# Patient Record
Sex: Male | Born: 1935 | Race: White | Hispanic: No | Smoking: Current every day smoker
Health system: Southern US, Community
[De-identification: ages and names within clinical notes are randomized; demographics above are authoritative.]

## PROBLEM LIST (undated history)

## (undated) DIAGNOSIS — J189 Pneumonia, unspecified organism: Secondary | ICD-10-CM

## (undated) DIAGNOSIS — I10 Essential (primary) hypertension: Secondary | ICD-10-CM

## (undated) DIAGNOSIS — G9340 Encephalopathy, unspecified: Secondary | ICD-10-CM

## (undated) DIAGNOSIS — I6521 Occlusion and stenosis of right carotid artery: Secondary | ICD-10-CM

## (undated) DIAGNOSIS — G309 Alzheimer's disease, unspecified: Secondary | ICD-10-CM

## (undated) DIAGNOSIS — F0280 Dementia in other diseases classified elsewhere without behavioral disturbance: Secondary | ICD-10-CM

---

## 1983-08-22 HISTORY — PX: FOOT FRACTURE SURGERY: SHX645

## 2008-06-11 ENCOUNTER — Emergency Department (HOSPITAL_COMMUNITY): Admission: EM | Admit: 2008-06-11 | Discharge: 2008-06-11 | Payer: Self-pay | Admitting: Emergency Medicine

## 2013-10-19 ENCOUNTER — Emergency Department (HOSPITAL_COMMUNITY): Payer: Medicare Other

## 2013-10-19 ENCOUNTER — Encounter (HOSPITAL_COMMUNITY): Payer: Self-pay | Admitting: Emergency Medicine

## 2013-10-19 ENCOUNTER — Inpatient Hospital Stay (HOSPITAL_COMMUNITY)
Admission: EM | Admit: 2013-10-19 | Discharge: 2013-10-22 | DRG: 056 | Disposition: A | Discharge status: Skilled Nursing Facility | Payer: Medicare Other | Attending: Internal Medicine | Admitting: Internal Medicine

## 2013-10-19 DIAGNOSIS — G309 Alzheimer's disease, unspecified: Principal | ICD-10-CM | POA: Diagnosis present

## 2013-10-19 DIAGNOSIS — R918 Other nonspecific abnormal finding of lung field: Secondary | ICD-10-CM

## 2013-10-19 DIAGNOSIS — G9340 Encephalopathy, unspecified: Secondary | ICD-10-CM | POA: Diagnosis present

## 2013-10-19 DIAGNOSIS — F17200 Nicotine dependence, unspecified, uncomplicated: Secondary | ICD-10-CM | POA: Diagnosis present

## 2013-10-19 DIAGNOSIS — J189 Pneumonia, unspecified organism: Secondary | ICD-10-CM

## 2013-10-19 DIAGNOSIS — IMO0002 Reserved for concepts with insufficient information to code with codable children: Secondary | ICD-10-CM

## 2013-10-19 DIAGNOSIS — F0280 Dementia in other diseases classified elsewhere without behavioral disturbance: Principal | ICD-10-CM | POA: Diagnosis present

## 2013-10-19 DIAGNOSIS — Z720 Tobacco use: Secondary | ICD-10-CM

## 2013-10-19 DIAGNOSIS — R9389 Abnormal findings on diagnostic imaging of other specified body structures: Secondary | ICD-10-CM | POA: Diagnosis present

## 2013-10-19 DIAGNOSIS — I6521 Occlusion and stenosis of right carotid artery: Secondary | ICD-10-CM | POA: Diagnosis present

## 2013-10-19 DIAGNOSIS — R269 Unspecified abnormalities of gait and mobility: Secondary | ICD-10-CM | POA: Diagnosis present

## 2013-10-19 DIAGNOSIS — R32 Unspecified urinary incontinence: Secondary | ICD-10-CM | POA: Diagnosis present

## 2013-10-19 DIAGNOSIS — Z801 Family history of malignant neoplasm of trachea, bronchus and lung: Secondary | ICD-10-CM

## 2013-10-19 DIAGNOSIS — I10 Essential (primary) hypertension: Secondary | ICD-10-CM | POA: Diagnosis present

## 2013-10-19 DIAGNOSIS — R410 Disorientation, unspecified: Secondary | ICD-10-CM

## 2013-10-19 DIAGNOSIS — E440 Moderate protein-calorie malnutrition: Secondary | ICD-10-CM | POA: Diagnosis present

## 2013-10-19 DIAGNOSIS — R531 Weakness: Secondary | ICD-10-CM | POA: Diagnosis present

## 2013-10-19 DIAGNOSIS — I6529 Occlusion and stenosis of unspecified carotid artery: Secondary | ICD-10-CM | POA: Diagnosis present

## 2013-10-19 DIAGNOSIS — Z8673 Personal history of transient ischemic attack (TIA), and cerebral infarction without residual deficits: Secondary | ICD-10-CM

## 2013-10-19 DIAGNOSIS — R29898 Other symptoms and signs involving the musculoskeletal system: Secondary | ICD-10-CM | POA: Diagnosis present

## 2013-10-19 HISTORY — DX: Pneumonia, unspecified organism: J18.9

## 2013-10-19 HISTORY — DX: Occlusion and stenosis of right carotid artery: I65.21

## 2013-10-19 HISTORY — DX: Alzheimer's disease, unspecified: G30.9

## 2013-10-19 HISTORY — DX: Dementia in other diseases classified elsewhere without behavioral disturbance: F02.80

## 2013-10-19 HISTORY — DX: Encephalopathy, unspecified: G93.40

## 2013-10-19 HISTORY — DX: Essential (primary) hypertension: I10

## 2013-10-19 LAB — BASIC METABOLIC PANEL
BUN: 11 mg/dL (ref 6–23)
CO2: 29 meq/L (ref 19–32)
CREATININE: 1.01 mg/dL (ref 0.50–1.35)
Calcium: 8.9 mg/dL (ref 8.4–10.5)
Chloride: 98 mEq/L (ref 96–112)
GFR calc Af Amer: 78 mL/min — ABNORMAL LOW (ref >90)
GFR calc non Af Amer: 68 mL/min — ABNORMAL LOW (ref >90)
Glucose, Bld: 103 mg/dL — ABNORMAL HIGH (ref 70–99)
Potassium: 3.6 mEq/L — ABNORMAL LOW (ref 3.7–5.3)
SODIUM: 137 meq/L (ref 137–147)

## 2013-10-19 LAB — CBC WITH DIFFERENTIAL/PLATELET
BASOS PCT: 0 % (ref 0–1)
Basophils Absolute: 0 10*3/uL (ref 0.0–0.1)
Eosinophils Absolute: 0.1 10*3/uL (ref 0.0–0.7)
Eosinophils Relative: 1 % (ref 0–5)
HCT: 40.7 % (ref 39.0–52.0)
HEMOGLOBIN: 13.7 g/dL (ref 13.0–17.0)
LYMPHS ABS: 0.7 10*3/uL (ref 0.7–4.0)
LYMPHS PCT: 6 % — AB (ref 12–46)
MCH: 29.7 pg (ref 26.0–34.0)
MCHC: 33.7 g/dL (ref 30.0–36.0)
MCV: 88.3 fL (ref 78.0–100.0)
MONO ABS: 0.8 10*3/uL (ref 0.1–1.0)
Monocytes Relative: 7 % (ref 3–12)
NEUTROS ABS: 9.7 10*3/uL — AB (ref 1.7–7.7)
Neutrophils Relative %: 86 % — ABNORMAL HIGH (ref 43–77)
PLATELETS: 188 10*3/uL (ref 150–400)
RBC: 4.61 MIL/uL (ref 4.22–5.81)
RDW: 13.4 % (ref 11.5–15.5)
WBC: 11.2 10*3/uL — ABNORMAL HIGH (ref 4.0–10.5)

## 2013-10-19 LAB — URINALYSIS, ROUTINE W REFLEX MICROSCOPIC
BILIRUBIN URINE: NEGATIVE
GLUCOSE, UA: NEGATIVE mg/dL
Hgb urine dipstick: NEGATIVE
Leukocytes, UA: NEGATIVE
Nitrite: NEGATIVE
PROTEIN: NEGATIVE mg/dL
SPECIFIC GRAVITY, URINE: 1.02 (ref 1.005–1.030)
UROBILINOGEN UA: 1 mg/dL (ref 0.0–1.0)
pH: 6 (ref 5.0–8.0)

## 2013-10-19 LAB — MAGNESIUM: Magnesium: 1.9 mg/dL (ref 1.5–2.5)

## 2013-10-19 MED ORDER — SODIUM CHLORIDE 0.9 % IJ SOLN
3.0000 mL | Freq: Two times a day (BID) | INTRAMUSCULAR | Status: DC
  Administered 2013-10-21: 3 mL via INTRAVENOUS

## 2013-10-19 MED ORDER — DEXTROSE 5 % IV SOLN
500.0000 mg | INTRAVENOUS | Status: DC
  Administered 2013-10-19 – 2013-10-20 (×2): 500 mg via INTRAVENOUS
  Filled 2013-10-19: qty 500

## 2013-10-19 MED ORDER — ALBUTEROL SULFATE (2.5 MG/3ML) 0.083% IN NEBU
2.5000 mg | INHALATION_SOLUTION | Freq: Once | RESPIRATORY_TRACT | Status: AC
  Administered 2013-10-19: 2.5 mg via RESPIRATORY_TRACT
  Filled 2013-10-19: qty 3

## 2013-10-19 MED ORDER — ALBUTEROL SULFATE (2.5 MG/3ML) 0.083% IN NEBU: 5.0000 mg | INHALATION_SOLUTION | Freq: Once | RESPIRATORY_TRACT | Status: DC

## 2013-10-19 MED ORDER — SODIUM CHLORIDE 0.9 % IV SOLN: INTRAVENOUS | Status: DC

## 2013-10-19 MED ORDER — ADULT MULTIVITAMIN W/MINERALS CH
1.0000 | ORAL_TABLET | Freq: Every day | ORAL | Status: DC
  Administered 2013-10-19 – 2013-10-22 (×4): 1 via ORAL
  Filled 2013-10-19 (×4): qty 1

## 2013-10-19 MED ORDER — FOLIC ACID 1 MG PO TABS
1.0000 mg | ORAL_TABLET | Freq: Every day | ORAL | Status: DC
  Administered 2013-10-19 – 2013-10-22 (×4): 1 mg via ORAL
  Filled 2013-10-19 (×4): qty 1

## 2013-10-19 MED ORDER — THIAMINE HCL 100 MG/ML IJ SOLN
100.0000 mg | Freq: Every day | INTRAMUSCULAR | Status: DC
Start: 2013-10-19 — End: 2013-10-20
  Administered 2013-10-19: 100 mg via INTRAVENOUS
  Filled 2013-10-19 (×2): qty 2

## 2013-10-19 MED ORDER — ENOXAPARIN SODIUM 40 MG/0.4ML ~~LOC~~ SOLN
40.0000 mg | SUBCUTANEOUS | Status: DC
  Administered 2013-10-19 – 2013-10-21 (×3): 40 mg via SUBCUTANEOUS
  Filled 2013-10-19 (×3): qty 0.4

## 2013-10-19 MED ORDER — ASPIRIN EC 325 MG PO TBEC
325.0000 mg | DELAYED_RELEASE_TABLET | Freq: Every day | ORAL | Status: DC
  Administered 2013-10-19 – 2013-10-22 (×4): 325 mg via ORAL
  Filled 2013-10-19 (×4): qty 1

## 2013-10-19 MED ORDER — ACETAMINOPHEN 325 MG PO TABS: 650.0000 mg | ORAL_TABLET | Freq: Four times a day (QID) | ORAL | Status: DC | PRN

## 2013-10-19 MED ORDER — CEFTRIAXONE SODIUM 1 G IJ SOLR
1.0000 g | INTRAMUSCULAR | Status: DC
  Administered 2013-10-20: 1 g via INTRAVENOUS
  Filled 2013-10-19 (×2): qty 10

## 2013-10-19 MED ORDER — ONDANSETRON HCL 4 MG PO TABS: 4.0000 mg | ORAL_TABLET | Freq: Four times a day (QID) | ORAL | Status: DC | PRN

## 2013-10-19 MED ORDER — ALBUTEROL SULFATE (2.5 MG/3ML) 0.083% IN NEBU: 2.5000 mg | INHALATION_SOLUTION | RESPIRATORY_TRACT | Status: DC | PRN

## 2013-10-19 MED ORDER — SODIUM CHLORIDE 0.9 % IV SOLN: INTRAVENOUS | Status: AC

## 2013-10-19 MED ORDER — ACETAMINOPHEN 650 MG RE SUPP: 650.0000 mg | Freq: Four times a day (QID) | RECTAL | Status: DC | PRN

## 2013-10-19 MED ORDER — DEXTROSE 5 % IV SOLN
1.0000 g | Freq: Once | INTRAVENOUS | Status: AC
  Administered 2013-10-19: 1 g via INTRAVENOUS
  Filled 2013-10-19: qty 10

## 2013-10-19 MED ORDER — ONDANSETRON HCL 4 MG/2ML IJ SOLN: 4.0000 mg | Freq: Four times a day (QID) | INTRAMUSCULAR | Status: DC | PRN

## 2013-10-19 MED ORDER — POTASSIUM CHLORIDE 10 MEQ/100ML IV SOLN
10.0000 meq | INTRAVENOUS | Status: AC
  Administered 2013-10-19 – 2013-10-20 (×4): 10 meq via INTRAVENOUS
  Filled 2013-10-19 (×2): qty 100

## 2013-10-19 MED ORDER — IPRATROPIUM BROMIDE 0.02 % IN SOLN: 0.5000 mg | Freq: Once | RESPIRATORY_TRACT | Status: DC

## 2013-10-19 MED ORDER — IPRATROPIUM-ALBUTEROL 0.5-2.5 (3) MG/3ML IN SOLN
3.0000 mL | Freq: Once | RESPIRATORY_TRACT | Status: AC
  Administered 2013-10-19: 3 mL via RESPIRATORY_TRACT
  Filled 2013-10-19: qty 3

## 2013-10-19 NOTE — ED Provider Notes (Signed)
CSN: [id number]     Arrival date & time 10/19/13  1438 History  This chart was scribed for Ward Givens, MD by Quintella Reichert, ED scribe.  This patient was seen in room APA18/APA18 and the patient's care was started at 3:10 PM.   Chief Complaint  Patient presents with  . Hypertension   Level 5 Caveat for confusion  The history is provided by the patient, a relative and the EMS personnel. No language interpreter was used.    HPI Comments: Kavonte Bearse is a 80 y.o. male brought in by EMS to the Emergency Department for evaluation of high blood pressure.  Pt's ex-wife called EMS and informed them that pt has been generally unwell recently.  When EMS arrived he had BP of 230/120, irregular HR with pulse of 100, and O2 of 90%.  Family also informed EMS that pt's "right leg went limp."  On arrival he was complaining of non-productive cough and generalized weakness to the triage nurse.  Currently he states he has had some difficulty walking for the past 2 days.  He states he is otherwise feeling fine.  He denies headache, CP, SOB, nausea, vomiting, or diarrhea.  He reports that he lives at home with his ex-wife and son.  A family member states that pt has no PCP and takes no prescription medications regularly.  He reports his family has been trying to get him to go to a doctor but he refuses.   Past Medical History  Diagnosis Date  . Hypertension     History reviewed. No pertinent past surgical history.  No family history on file.   History  Substance Use Topics  . Smoking status: Current Every Day Smoker  . Smokeless tobacco: Not on file  . Alcohol Use: No   lives at home Lives with ex-wife  Review of Systems  Respiratory: Positive for cough. Negative for shortness of breath.   Cardiovascular: Negative for chest pain.  Gastrointestinal: Negative for nausea, vomiting and diarrhea.  Neurological: Positive for weakness (generalized weakness and "right leg went limp"). Negative for  headaches.  All other systems reviewed and are negative.      Allergies  Review of patient's allergies indicates no known allergies.  Home Medications  No current outpatient prescriptions on file.  Pulse 106  Temp(Src) 98 F (36.7 C) (Oral)  Resp 20  Ht 5\' 7"  (1.702 m)  Wt 155 lb (70.308 kg)  BMI 24.27 kg/m2  SpO2 94%  Vital signs normal except tachycardia   Physical Exam  Nursing note and vitals reviewed. Constitutional: He appears well-developed and well-nourished.  Non-toxic appearance. He does not appear ill. No distress.  HENT:  Head: Normocephalic and atraumatic.  Right Ear: External ear normal.  Left Ear: External ear normal.  Nose: Nose normal. No mucosal edema or rhinorrhea.  Mouth/Throat: Oropharynx is clear and moist and mucous membranes are normal. No dental abscesses or uvula swelling.  Eyes: Conjunctivae and EOM are normal. Pupils are equal, round, and reactive to light.  Neck: Normal range of motion and full passive range of motion without pain. Neck supple.  Cardiovascular: Normal rate, regular rhythm and normal heart sounds.  Exam reveals no gallop and no friction rub.   No murmur heard. Pulmonary/Chest: Effort normal. No respiratory distress. He has wheezes. He has no rhonchi. He has no rales. He exhibits no tenderness and no crepitus.  Very faint expiratory wheezes diffusely  Abdominal: Soft. Normal appearance and bowel sounds are normal. He exhibits no  distension. There is no tenderness. There is no rebound and no guarding.  Musculoskeletal: Normal range of motion. He exhibits no edema and no tenderness.  Moves all extremities well.   Neurological: He is alert. He has normal strength. No cranial nerve deficit.  Pt does not know the day, month or year. He does not know where he lives.   Skin: Skin is warm, dry and intact. No rash noted. No erythema. No pallor.  Psychiatric: He has a normal mood and affect. His speech is normal and behavior is normal.  His mood appears not anxious.    ED Course  Procedures (including critical care time)  Medications  azithromycin (ZITHROMAX) 500 mg in dextrose 5 % 250 mL IVPB (0 mg Intravenous Stopped 10/19/13 1824)  cefTRIAXone (ROCEPHIN) 1 g in dextrose 5 % 50 mL IVPB (0 g Intravenous Stopped 10/19/13 1635)  ipratropium-albuterol (DUONEB) 0.5-2.5 (3) MG/3ML nebulizer solution 3 mL (3 mLs Nebulization Given 10/19/13 1622)  albuterol (PROVENTIL) (2.5 MG/3ML) 0.083% nebulizer solution 2.5 mg (2.5 mg Nebulization Given 10/19/13 1622)     DIAGNOSTIC STUDIES: Oxygen Saturation is 94% on room air, adequate by my interpretation.    COORDINATION OF CARE: 3:14 PM-Discussed treatment plan which includes EKG, CXR, and labs with pt at bedside and pt agreed to plan.  BP during my exam was 160/90  Patient was given nebulizer treatment for his apparent dyspnea. After reviewing his x-ray he was started on community-acquired pneumonia antibiotics.  [id number] Ex-wife is here who is his primary caregiver. She states the patient has not seen a physician since 1987. She reports he's been getting confused for the past several months. For the last several weeks he has started having difficulty walking even using his walker. She states when he stands up his legs tremble. She states today when he got up somewhere between one to 2 PM his right foot kept turning in and he was having trouble walking.  CT of head and urinalysis ordered.  16:09 Dr Sunnie Nielsen admit to med-surg, team 2  Labs Review Results for orders placed during the hospital encounter of 10/19/13  BASIC METABOLIC PANEL      Result Value Ref Range   Sodium 137  137 - 147 mEq/L   Potassium 3.6 (*) 3.7 - 5.3 mEq/L   Chloride 98  96 - 112 mEq/L   CO2 29  19 - 32 mEq/L   Glucose, Bld 103 (*) 70 - 99 mg/dL   BUN 11  6 - 23 mg/dL   Creatinine, Ser 1.61  0.50 - 1.35 mg/dL   Calcium 8.9  8.4 - 09.6 mg/dL   GFR calc non Af Amer 68 (*) >90 mL/min   GFR calc Af Amer 78 (*) >90  mL/min  CBC WITH DIFFERENTIAL      Result Value Ref Range   WBC 11.2 (*) 4.0 - 10.5 K/uL   RBC 4.61  4.22 - 5.81 MIL/uL   Hemoglobin 13.7  13.0 - 17.0 g/dL   HCT 04.5  40.9 - 81.1 %   MCV 88.3  78.0 - 100.0 fL   MCH 29.7  26.0 - 34.0 pg   MCHC 33.7  30.0 - 36.0 g/dL   RDW 91.4  78.2 - 95.6 %   Platelets 188  150 - 400 K/uL   Neutrophils Relative % 86 (*) 43 - 77 %   Neutro Abs 9.7 (*) 1.7 - 7.7 K/uL   Lymphocytes Relative 6 (*) 12 - 46 %   Lymphs Abs 0.7  0.7 - 4.0 K/uL   Monocytes Relative 7  3 - 12 %   Monocytes Absolute 0.8  0.1 - 1.0 K/uL   Eosinophils Relative 1  0 - 5 %   Eosinophils Absolute 0.1  0.0 - 0.7 K/uL   Basophils Relative 0  0 - 1 %   Basophils Absolute 0.0  0.0 - 0.1 K/uL  URINALYSIS, ROUTINE W REFLEX MICROSCOPIC      Result Value Ref Range   Color, Urine AMBER (*) YELLOW   APPearance CLEAR  CLEAR   Specific Gravity, Urine 1.020  1.005 - 1.030   pH 6.0  5.0 - 8.0   Glucose, UA NEGATIVE  NEGATIVE mg/dL   Hgb urine dipstick NEGATIVE  NEGATIVE   Bilirubin Urine NEGATIVE  NEGATIVE   Ketones, ur TRACE (*) NEGATIVE mg/dL   Protein, ur NEGATIVE  NEGATIVE mg/dL   Urobilinogen, UA 1.0  0.0 - 1.0 mg/dL   Nitrite NEGATIVE  NEGATIVE   Leukocytes, UA NEGATIVE  NEGATIVE    Laboratory interpretation all normal except for leukocytosis, mild hypokalemia   Imaging Review Dg Chest Portable 1 View  10/19/2013   CLINICAL DATA:  Elevated blood pressure.  EXAM: PORTABLE CHEST - 1 VIEW  COMPARISON:  None.  FINDINGS: Remote right rib trauma. Patient rotated right. Normal heart size and mediastinal contours for age. Right costophrenic angle blunting could relate to minimal fluid or pleural thickening. No pneumothorax. Lower lobe predominant interstitial thickening is nonspecific, especially in this age group. Slightly greater on the left. No lobar consolidation. An irregular opacity projecting over the left upper lung may represent a calcified pleural plaque.  IMPRESSION: Lower lobe  predominant interstitial thickening is nonspecific in this age group. Slightly greater on the left. This could represent the sequelae of chronic bronchitis or smoking. Difficult to exclude early acute infectious process, given absence of prior radiographs. Consider further evaluation with PA and lateral radiograph and consideration of plain film followup.  Small right pleural effusion versus pleural thickening.  Suspicion of calcified pleural plaque within the upper left hemi thorax. Considerations include prior complex left pleural effusion or even asbestos related pleural disease. This could also be further characterized with PA and lateral radiographs.   Electronically Signed   By: Jeronimo GreavesKyle  Talbot M.D.   On: 10/19/2013 15:13     EKG Interpretation   Date/Time:  Sunday October 19 2013 14:52:33 EST Ventricular Rate:  100 PR Interval:  166 QRS Duration: 94 QT Interval:  372 QTC Calculation: 479 R Axis:   23 Text Interpretation:  Normal sinus rhythm Possible Left atrial enlargement  Cannot rule out Anterior infarct , age undetermined No previous ECGs  available Confirmed by Glanda Spanbauer  MD-I, Wylodean Shimmel ([id number]) on 10/19/2013 3:45:59 PM      MDM   elderly patient who smokes and has been having worsening confusion, and weakness. He has presence of old strokes on his CT scan and possibly had another stroke today. Patient has not had any medical care in about 25 years. He is being admitted for further evaluation.    Final diagnoses:  Weakness  CAP (community acquired pneumonia)  Urinary incontinence  Confusion    Plan admission  Devoria AlbeIva Shantina Chronister, MD, FACEP   I personally performed the services described in this documentation, which was scribed in my presence. The recorded information has been reviewed and considered.  Devoria AlbeIva Krystyna Cleckley, MD, Armando GangFACEP    Ward GivensIva L Miyoko Hashimi, MD 10/19/13 2126

## 2013-10-19 NOTE — H&P (Signed)
[facility] History and Physical  Jason Pratt EFE:[id number] DOB: 01/24/1930 DOA: 10/19/2013   PCP: Does not have a PCP Specialists: None  Chief Complaint: Confusion, Right leg weakness, unsteady gait  HPI: Jason Pratt is a 80 y.o. male who does not have any significant past medical history primarily because he hasn't seen a physician in many years. Patient is accompanied by his ex-wife and son. Ex-wife was the main historian. Patient was unable to provide much information. Apparently, for the past 2 weeks at least, ex-wife has noticed that the patient has had worsening gait. He almost fell a few times. But hasn't had a fall. He has also had incontinence of urine at times. He was also noted to have speech impairment. And, at times she's also seen right facial droop. No seizure activity. No fever. No chills. She's also noticed that especially for the last few months his memory has been getting worse. There's been no history of any back pain. Patient denies any back pain at this time. No stool incontinence. Patient's ex-wife denies any choking episode with eating or drinking. And, then today patient was noted have weakness in the right leg. He couldn't bear weight on that leg. She also noticed that he was not able to move his right foot at that time. So EMS was called and the patient was brought into the hospital. Patient has a remote history of alcoholism, but hasn't had anything to drink since the 90s. Continues to smoke cigarettes about half a pack on a daily basis. Has required a walker to ambulate over the past one year, but the ex-wife has seen a decline over this time period as well.  Home Medications: Prior to Admission medications   Not on File    Allergies: No Known Allergies  Past Medical History: Past Medical History  Diagnosis Date  . Hypertension     Past Surgical History  Procedure Laterality Date  . Foot fracture surgery Left 1985    Social History: Patient lives with  his ex-wife and son. Half pack of cigarettes on a daily basis. Previous use of alcohol. No illicit drug use. Uses a walker at home.  Family History:  Family History  Problem Relation Age of Onset  . Lung cancer Sister      Review of Systems - unable to do due to his mental status  Physical Examination  Filed Vitals:   10/19/13 1540 10/19/13 1622 10/19/13 1817 10/19/13 1900  BP: 160/79  114/70 157/78  Pulse:    98  Temp:    98.1 F (36.7 C)  TempSrc:    Oral  Resp: 28  31   Height:    _0  (1.6 m)  Weight:    64.2 kg (141 lb 8.6 oz)  SpO2: 96% 98% 97% 98%    BP 157/78  Pulse 98  Temp(Src) 98.1 F (36.7 C) (Oral)  Resp 31  Ht _1  (1.6 m)  Wt 64.2 kg (141 lb 8.6 oz)  BMI 25.08 kg/m2  SpO2 98%  General appearance: alert, appears stated age, distracted, no distress and uncooperative Head: Normocephalic, without obvious abnormality, atraumatic Eyes: conjunctivae/corneas clear. PERRL, EOM's intact.  Throat: lips, mucosa, and tongue normal; teeth and gums normal Neck: no adenopathy, no carotid bruit, no JVD, supple, symmetrical, trachea midline and thyroid not enlarged, symmetric, no tenderness/mass/nodules Resp: Coarse breath sounds bilaterally, without any definite crackles or wheezing Cardio: regular rate and rhythm, S1, S2 normal, no murmur, click, rub or gallop GI: soft, non-tender;  bowel sounds normal; no masses,  no organomegaly Extremities: extremities normal, atraumatic, no cyanosis or edema. Normal range of motion of knees and hips passively. Straight leg raising test was unremarkable bilaterally. Pulses: 2+ and symmetric Skin: Skin color, texture, turgor normal. No rashes or lesions Lymph nodes: Cervical, supraclavicular, and axillary nodes normal. Neurologic: He is alert. No facial droop is noted. Tongue is midline. No cranial nerve deformity. Patient was not fully able to cooperate with my examination. He could not follow certain commands. He was also noted to  have some difficulty with speech and couldn't express himself properly. Strength was 5 out of 5 in both the upper extremities. Right lower extremity was 4-5 out of 5 bilaterally, but reflexes were normal on the left and subdued in the right. He was able to move his right foot without any difficulty. Gait was not assessed. No clear pronator drift was identified. Cerebellar signs are difficult to elicit as the patient could not follow commands.   Laboratory Data: Results for orders placed during the hospital encounter of 10/19/13 (from the past 48 hour(s))  BASIC METABOLIC PANEL     Status: Abnormal   Collection Time    10/19/13  3:18 PM      Result Value Ref Range   Sodium 137  137 - 147 mEq/L   Potassium 3.6 (*) 3.7 - 5.3 mEq/L   Chloride 98  96 - 112 mEq/L   CO2 29  19 - 32 mEq/L   Glucose, Bld 103 (*) 70 - 99 mg/dL   BUN 11  6 - 23 mg/dL   Creatinine, Ser 1.01  0.50 - 1.35 mg/dL   Calcium 8.9  8.4 - 10.5 mg/dL   GFR calc non Af Amer 68 (*) >90 mL/min   GFR calc Af Amer 78 (*) >90 mL/min   Comment: (NOTE)     The eGFR has been calculated using the CKD EPI equation.     This calculation has not been validated in all clinical situations.     eGFR's persistently <90 mL/min signify possible Chronic Kidney     Disease.  CBC WITH DIFFERENTIAL     Status: Abnormal   Collection Time    10/19/13  3:18 PM      Result Value Ref Range   WBC 11.2 (*) 4.0 - 10.5 K/uL   RBC 4.61  4.22 - 5.81 MIL/uL   Hemoglobin 13.7  13.0 - 17.0 g/dL   HCT 40.7  39.0 - 52.0 %   MCV 88.3  78.0 - 100.0 fL   MCH 29.7  26.0 - 34.0 pg   MCHC 33.7  30.0 - 36.0 g/dL   RDW 13.4  11.5 - 15.5 %   Platelets 188  150 - 400 K/uL   Neutrophils Relative % 86 (*) 43 - 77 %   Neutro Abs 9.7 (*) 1.7 - 7.7 K/uL   Lymphocytes Relative 6 (*) 12 - 46 %   Lymphs Abs 0.7  0.7 - 4.0 K/uL   Monocytes Relative 7  3 - 12 %   Monocytes Absolute 0.8  0.1 - 1.0 K/uL   Eosinophils Relative 1  0 - 5 %   Eosinophils Absolute 0.1  0.0 -  0.7 K/uL   Basophils Relative 0  0 - 1 %   Basophils Absolute 0.0  0.0 - 0.1 K/uL  URINALYSIS, ROUTINE W REFLEX MICROSCOPIC     Status: Abnormal   Collection Time    10/19/13  4:50 PM  Result Value Ref Range   Color, Urine AMBER (*) YELLOW   Comment: BIOCHEMICALS MAY BE AFFECTED BY COLOR   APPearance CLEAR  CLEAR   Specific Gravity, Urine 1.020  1.005 - 1.030   pH 6.0  5.0 - 8.0   Glucose, UA NEGATIVE  NEGATIVE mg/dL   Hgb urine dipstick NEGATIVE  NEGATIVE   Bilirubin Urine NEGATIVE  NEGATIVE   Ketones, ur TRACE (*) NEGATIVE mg/dL   Protein, ur NEGATIVE  NEGATIVE mg/dL   Urobilinogen, UA 1.0  0.0 - 1.0 mg/dL   Nitrite NEGATIVE  NEGATIVE   Leukocytes, UA NEGATIVE  NEGATIVE   Comment: MICROSCOPIC NOT DONE ON URINES WITH NEGATIVE PROTEIN, BLOOD, LEUKOCYTES, NITRITE, OR GLUCOSE <1000 mg/dL.    Radiology Reports: Ct Head Wo Contrast  10/19/2013   CLINICAL DATA:  Altered level of consciousness.  Hypertension.  EXAM: CT HEAD WITHOUT CONTRAST  TECHNIQUE: Contiguous axial images were obtained from the base of the skull through the vertex without intravenous contrast.  COMPARISON:  None.  FINDINGS: No intracranial hemorrhage.  Moderate small vessel disease type changes.  Remote small frontal lobe infarcts and right thalamic infarct. No CT evidence of large acute infarct. If small/medium size infarct is of high clinical concern, MR can be obtained for further delineation.  Atrophy. Ventricular prominence may be related to atrophy. Difficult to exclude a very mild component of hydrocephalus.  No intracranial mass lesion noted on this unenhanced exam.  Vascular calcifications.  IMPRESSION: No intracranial hemorrhage.  Moderate small vessel disease type changes.  Remote small frontal lobe infarcts and right thalamic infarct. No CT evidence of large acute infarct.  Atrophy. Ventricular prominence may be related to atrophy. Difficult to exclude a very mild component of hydrocephalus.  Please see above.    Electronically Signed   By: Chauncey Cruel M.D.   On: 10/19/2013 17:43   Dg Chest Portable 1 View  10/19/2013   CLINICAL DATA:  Elevated blood pressure.  EXAM: PORTABLE CHEST - 1 VIEW  COMPARISON:  None.  FINDINGS: Remote right rib trauma. Patient rotated right. Normal heart size and mediastinal contours for age. Right costophrenic angle blunting could relate to minimal fluid or pleural thickening. No pneumothorax. Lower lobe predominant interstitial thickening is nonspecific, especially in this age group. Slightly greater on the left. No lobar consolidation. An irregular opacity projecting over the left upper lung may represent a calcified pleural plaque.  IMPRESSION: Lower lobe predominant interstitial thickening is nonspecific in this age group. Slightly greater on the left. This could represent the sequelae of chronic bronchitis or smoking. Difficult to exclude early acute infectious process, given absence of prior radiographs. Consider further evaluation with PA and lateral radiograph and consideration of plain film followup.  Small right pleural effusion versus pleural thickening.  Suspicion of calcified pleural plaque within the upper left hemi thorax. Considerations include prior complex left pleural effusion or even asbestos related pleural disease. This could also be further characterized with PA and lateral radiographs.   Electronically Signed   By: Abigail Miyamoto M.D.   On: 10/19/2013 15:13    Electrocardiogram: Sinus rhythm. 100 beats per minute. Normal axis. Intervals are normal. No Q waves. No concerning ST or T-wave changes are noted.  Problem List  Principal Problem:   Acute encephalopathy Active Problems:   Weakness   Right leg weakness   CAP (community acquired pneumonia)   Urinary incontinence   Abnormal CXR   Assessment: This is 80 year old, Caucasian male, who doesn't normally see a  physician, who presents with worsening confusion, unsteady gait and possible right lower extremity  weakness earlier today. CT of the head shows old strokes. It's likely patient may have had an ischemic event recently. It is also likely that he has significant cognitive impairment. He could have nutritional deficiencies. Unlikely to be any lumbar spine pathology based on examination, and history.  Plan: #1 right lower extremity weakness, and unsteady gait: He will require further neurological workup in the form of MRI brain. Will order  carotid Dopplers. [facility] consult neurology. He'll be given aspirin. Check B12 levels. PT and OT will be consulted. CT scan of the head raised the possibility of hydrocephalus, but was thought to be mild and possibly related to atrophy. We'll wait for neurological opinion.  #2 acute encephalopathy: I think his cognitive impairment has been going on for a long period of time. He may have had an acute worsening in the last couple of weeks to months. This could be vascular dementia. He could also have Alzheimers dementia. We'll wait for MRI. Dementia workup, will be initiated with TSH, RPR, B12. EEG will be ordered as well. Neurology input will be obtained. Urine drug screen will be checked. Swallow evaluation will be obtained  #3 questionable community acquired pneumonia: Continue with ceftriaxone and azithromycin. Check for strep and Legionella antigens in the urine.  #4 Abnormal chest x-ray: The possibility was raised about a pleural plaque in the left upper lobe. We'll get a 2 view chest x-ray in the morning. Consideration can be given to other imaging study such as a CT scan depending on findings on the 2 view x-ray. He is a smoker and so is at risk for malignancy.  DVT Prophylaxis: Lovenox Code Status: Full code Family Communication: Discussed with the patient's ex-wife and son  Disposition Plan: Admitted to telemetry   Further management decisions will depend on results of further testing and patient's response to treatment.  [facility]  [facility] Pager [phone]  If 7PM-7AM, please contact night-coverage [email or web address] Password [email or web address]  10/19/2013, 8:05 PM

## 2013-10-19 NOTE — ED Notes (Signed)
Pt here for evaluation of high blood pressure

## 2013-10-19 NOTE — ED Notes (Signed)
Care giver here now. States pt was  Having trouble with his right leg. States he was trying to ambulate to the bathroom on his walker and he almost fell.

## 2013-10-20 ENCOUNTER — Inpatient Hospital Stay (HOSPITAL_COMMUNITY): Payer: Medicare Other

## 2013-10-20 ENCOUNTER — Inpatient Hospital Stay (HOSPITAL_COMMUNITY)
Admit: 2013-10-20 | Discharge: 2013-10-20 | Disposition: A | Discharge status: Home / Self Care | Payer: Medicare Other | Attending: Internal Medicine | Admitting: Internal Medicine

## 2013-10-20 DIAGNOSIS — I517 Cardiomegaly: Secondary | ICD-10-CM

## 2013-10-20 LAB — CBC
HCT: 37 % — ABNORMAL LOW (ref 39.0–52.0)
Hemoglobin: 12.4 g/dL — ABNORMAL LOW (ref 13.0–17.0)
MCH: 29.5 pg (ref 26.0–34.0)
MCHC: 33.5 g/dL (ref 30.0–36.0)
MCV: 87.9 fL (ref 78.0–100.0)
PLATELETS: 162 10*3/uL (ref 150–400)
RBC: 4.21 MIL/uL — ABNORMAL LOW (ref 4.22–5.81)
RDW: 13.3 % (ref 11.5–15.5)
WBC: 8.2 10*3/uL (ref 4.0–10.5)

## 2013-10-20 LAB — COMPREHENSIVE METABOLIC PANEL
ALBUMIN: 3.1 g/dL — AB (ref 3.5–5.2)
ALK PHOS: 105 U/L (ref 39–117)
ALT: 10 U/L (ref 0–53)
AST: 15 U/L (ref 0–37)
BUN: 8 mg/dL (ref 6–23)
CO2: 28 mEq/L (ref 19–32)
Calcium: 8.8 mg/dL (ref 8.4–10.5)
Chloride: 104 mEq/L (ref 96–112)
Creatinine, Ser: 0.85 mg/dL (ref 0.50–1.35)
GFR calc non Af Amer: 80 mL/min — ABNORMAL LOW (ref >90)
Glucose, Bld: 92 mg/dL (ref 70–99)
POTASSIUM: 3.9 meq/L (ref 3.7–5.3)
Sodium: 141 mEq/L (ref 137–147)
Total Bilirubin: 0.8 mg/dL (ref 0.3–1.2)
Total Protein: 6.9 g/dL (ref 6.0–8.3)

## 2013-10-20 LAB — RAPID URINE DRUG SCREEN, HOSP PERFORMED
AMPHETAMINES: NOT DETECTED
Barbiturates: NOT DETECTED
Benzodiazepines: POSITIVE — AB
Cocaine: NOT DETECTED
OPIATES: NOT DETECTED
Tetrahydrocannabinol: NOT DETECTED

## 2013-10-20 LAB — PROTIME-INR
INR: 1.11 (ref 0.00–1.49)
Prothrombin Time: 14.1 seconds (ref 11.6–15.2)

## 2013-10-20 LAB — HOMOCYSTEINE: HOMOCYSTEINE-NORM: 11.5 umol/L (ref 4.0–15.4)

## 2013-10-20 LAB — VITAMIN B12
VITAMIN B 12: 398 pg/mL (ref 211–911)
Vitamin B-12: 446 pg/mL (ref 211–911)

## 2013-10-20 LAB — RPR
RPR Ser Ql: NONREACTIVE
RPR: NONREACTIVE

## 2013-10-20 LAB — TSH
TSH: 2.005 u[IU]/mL (ref 0.350–4.500)
TSH: 1.668 u[IU]/mL (ref 0.350–4.500)

## 2013-10-20 LAB — HEMOGLOBIN A1C
Hgb A1c MFr Bld: 5.4 % (ref <5.7)
Mean Plasma Glucose: 108 mg/dL (ref <117)

## 2013-10-20 LAB — STREP PNEUMONIAE URINARY ANTIGEN: STREP PNEUMO URINARY ANTIGEN: NEGATIVE

## 2013-10-20 LAB — HIV ANTIBODY (ROUTINE TESTING W REFLEX): HIV: NONREACTIVE

## 2013-10-20 MED ORDER — ENSURE COMPLETE PO LIQD
237.0000 mL | Freq: Two times a day (BID) | ORAL | Status: DC
  Administered 2013-10-20 – 2013-10-22 (×4): 237 mL via ORAL

## 2013-10-20 MED ORDER — VITAMIN B-1 100 MG PO TABS
100.0000 mg | ORAL_TABLET | Freq: Every day | ORAL | Status: DC
Start: 2013-10-20 — End: 2013-10-22
  Administered 2013-10-20 – 2013-10-22 (×3): 100 mg via ORAL
  Filled 2013-10-20 (×3): qty 1

## 2013-10-20 MED ORDER — DONEPEZIL HCL 5 MG PO TABS
5.0000 mg | ORAL_TABLET | Freq: Every day | ORAL | Status: DC
  Administered 2013-10-20 – 2013-10-21 (×2): 5 mg via ORAL
  Filled 2013-10-20 (×2): qty 1

## 2013-10-20 NOTE — Progress Notes (Signed)
INITIAL NUTRITION ASSESSMENT  DOCUMENTATION CODES Per approved criteria  -Non-severe (moderate) malnutrition in the context of chronic illness   INTERVENTION: Ensure Complete po BID, each supplement provides 350 kcal and 13 grams of protein  NUTRITION DIAGNOSIS: Inadequate oral intake related to decreased appetite as evidenced by hx wt loss, poor appetite PTA.   Goal: Pt will meet >90% of estimated nutritional needs  Monitor:  PO intake, skin assessments, weight changes, labs, I/O's  Reason for Assessment: MST=3  80 y.o. male  Admitting Dx: Acute encephalopathy  ASSESSMENT: Pt admitted for CAP. He complains of being hungry, however, PTA admits to poor appetite for at least 1 month. Ex-wife at bedside reports that intake has been minimal and pt has only been eating sweets, coffee, and Pepsi. She reports that she has been serving him chopped meats, but he will usually eat only 2-3 bites of what is served at meals.  She reports frustration, stating she has been preparing several meals a days in attempt to get him to eat. She has recently been giving him one Ensure daily as well. No current PO data available at this time, but ex-wife confirms intake has been poor during admission as well.  Appreciative of dysphagia 1 diet order, due to masticatory difficulty, as pt has no teeth.  Pt and ex-wife report UBW of 165#. They confirm weight loss, but unable to quantify time frame. Pt estimates he has lost approximately 10# in the last 3 months. This would be a 6.6% wt loss x 3 months, which is not clinically significant. Unfortunately, there is no further weight hx available at this time, as pt has not seen a physician in many years, despite encouragement from family members.   Nutrition Focused Physical Exam:  Subcutaneous Fat:  Orbital Region: WDL Upper Arm Region: WDL Thoracic and Lumbar Region: WDL  Muscle:  Temple Region: moderate depletion Clavicle Bone Region: WDL Clavicle and  Acromion Bone Region: WDL Scapular Bone Region: moderate depletion Dorsal Hand: WDL Patellar Region: moderate depletion Anterior Thigh Region: moderate depletion Posterior Calf Region: WDL  Edema: none present  Pt meets criteria for moderate MALNUTRITION in the context of chronic illness as evidenced by <75% of estimated energy intake x 1 month, moderate muscle depletion.  Height: Ht Readings from Last 1 Encounters:  10/19/13 5\' 3"  (1.6 m)    Weight: Wt Readings from Last 1 Encounters:  10/19/13 141 lb 8.6 oz (64.2 kg)    Ideal Body Weight: 124#  % Ideal Body Weight: 114%  Wt Readings from Last 10 Encounters:  10/19/13 141 lb 8.6 oz (64.2 kg)    Usual Body Weight: 165#  % Usual Body Weight: 85%  BMI:  Body mass index is 25.08 kg/(m^2). Meets criteria for overweight.  Estimated Nutritional Needs: Kcal: 1200-1300 daily Protein: 51-64 grams daily Fluid: 1.2-1.3 L daily  Skin: WDL  Diet Order: Dysphagia  EDUCATION NEEDS: -Education needs addressed   Intake/Output Summary (Last 24 hours) at 10/20/13 1150 Last data filed at 10/20/13 1000  Gross per 24 hour  Intake      0 ml  Output   1050 ml  Net  -1050 ml    Last BM: 10/19/13   Labs:   Recent Labs Lab 10/19/13 1518 10/20/13 0648  NA 137 141  K 3.6* 3.9  CL 98 104  CO2 29 28  BUN 11 8  CREATININE 1.01 0.85  CALCIUM 8.9 8.8  MG 1.9  --   GLUCOSE 103* 92    CBG (  last 3)  No results found for this basename: GLUCAP,  in the last 72 hours  Scheduled Meds: . aspirin EC  325 mg Oral Daily  . azithromycin  500 mg Intravenous Q24H  . cefTRIAXone (ROCEPHIN)  IV  1 g Intravenous Q24H  . donepezil  5 mg Oral QHS  . enoxaparin (LOVENOX) injection  40 mg Subcutaneous Q24H  . folic acid  1 mg Oral Daily  . multivitamin with minerals  1 tablet Oral Daily  . sodium chloride  3 mL Intravenous Q12H  . thiamine  100 mg Oral Daily    Continuous Infusions:   Past Medical History  Diagnosis Date  .  Hypertension     Past Surgical History  Procedure Laterality Date  . Foot fracture surgery Left 1985    [facility] Odoherty A. Mayford Knife, RD, LDN Pager: [phone]

## 2013-10-20 NOTE — Progress Notes (Signed)
[facility] PROGRESS NOTE  Jason Pratt [facility]:[id number] DOB: 12-Dec-1929 DOA: 10/19/2013 PCP: No PCP Per Patient   Assessment: This is 80 year old, Caucasian male, who doesn't normally see a physician, who presented with worsening confusion, unsteady gait and possible right lower extremity weakness. CT of the head showed old strokes. Awaiting neurology consult as well as MRI, echo.     Assessment/Plan: #1 right lower extremity weakness, and unsteady gait: concern for stroke, worsening dementia and/or nutritional deficits. Likely related to worsening dementia per clinical history.  MRI brain without acute abnormality. Carotid Dopplers with moderate atherosclerotic plaque at both carotid bifurcations. Estimated right ICA stenosis is 50- 69% and left ICA stenosis is less than 50. Await neurology recommendations. Continue aspirin. B12, RPR, TSH, Homocysteine levels pending. PT recommending snf. CT scan of the head raised the possibility of hydrocephalus, but was thought to be mild and possibly related to atrophy. We'll wait for neurological opinion. Will request social work consult for placement.   #2 acute encephalopathy: Clinical hx indicates going on for a long period of time. Neuro opines likely related to dementia of Alzheimer's type. TSH, RPR, B12 pending. EEG pending. Appreciate Neurology input. Urine drug screen +benzo.   #3 questionable community acquired pneumonia: Continue with ceftriaxone and azithromycin day #2. await strep and Legionella antigens in the urine. He is afebrile and non-toxic appearing. Oxygen saturation level >94% on room air.    #4 Abnormal chest x-ray: The possibility was raised about a pleural plaque in the left upper lobe. 2 view chest x-ray yields linear opacity in the periphery of the low upper left hemithorax may reflect scarring, atelectasis or pleural plaque. He is a smoker and so is at risk for malignancy.     Code Status: full Family Communication: ex-wife  at bedside Disposition Plan: snf hopefully tomorrow   Consultants:  Dr. Gerilyn Pilgrim neurology  Procedures: Carotid duplex with Moderate atherosclerotic plaque at both carotid bifurcations. Estimated right ICA stenosis is 50- 69% and left ICA stenosis is  less than 50%.  Antibiotics:  Azithromycin 10/19/13>>  Rocephin 10/19/13>>>  HPI/Subjective: Sitting in chair. Denies pain/discomfort.   Objective: Filed Vitals:   10/20/13 0404  BP: 141/77  Pulse: 92  Temp: 98.1 F (36.7 C)  Resp: 20    Intake/Output Summary (Last 24 hours) at 10/20/13 1139 Last data filed at 10/20/13 1000  Gross per 24 hour  Intake      0 ml  Output   1050 ml  Net  -1050 ml   Filed Weights   10/19/13 1440 10/19/13 1900  Weight: 70.308 kg (155 lb) 64.2 kg (141 lb 8.6 oz)    Exam:   General:  Well nourished somewhat unkept appearing NAD  Cardiovascular: RRR No MGR no LE edema  Respiratory: normal effort BS with diffuse coarseness. No wheeze no crackles  Abdomen: soft +BS non-tender to palpation  Musculoskeletal: bilateral UE strength 5/5.  RLE strength 4/5 LLE strength 5/5. No clubbing or cyanosis   Neuro: speech slow but clear. Facial symmetry. Able to follow simple commands.   Data Reviewed: Basic Metabolic Panel:  Recent Labs Lab 10/19/13 1518 10/20/13 0648  NA 137 141  K 3.6* 3.9  CL 98 104  CO2 29 28  GLUCOSE 103* 92  BUN 11 8  CREATININE 1.01 0.85  CALCIUM 8.9 8.8  MG 1.9  --    Liver Function Tests:  Recent Labs Lab 10/20/13 0648  AST 15  ALT 10  ALKPHOS 105  BILITOT 0.8  PROT 6.9  ALBUMIN 3.1*   No results found for this basename: LIPASE, AMYLASE,  in the last 168 hours No results found for this basename: AMMONIA,  in the last 168 hours CBC:  Recent Labs Lab 10/19/13 1518 10/20/13 0648  WBC 11.2* 8.2  NEUTROABS 9.7*  --   HGB 13.7 12.4*  HCT 40.7 37.0*  MCV 88.3 87.9  PLT 188 162   Cardiac Enzymes: No results found for this basename: CKTOTAL,  CKMB, CKMBINDEX, TROPONINI,  in the last 168 hours BNP (last 3 results) No results found for this basename: PROBNP,  in the last 8760 hours CBG: No results found for this basename: GLUCAP,  in the last 168 hours  No results found for this or any previous visit (from the past 240 hour(s)).   Studies: Dg Chest 2 View  10/20/2013   CLINICAL DATA:  Pneumonia.  EXAM: CHEST  2 VIEW  COMPARISON:  Chest x-ray 10/19/2013.  FINDINGS: Linear opacities in the periphery of the upper left hemithorax are again noted, favored to represent areas of subsegmental atelectasis, scarring or potential pleural plaques. Patchy areas of interstitial prominence are noted throughout the mid to lower lungs bilaterally. No frank consolidative airspace disease. No definite pleural effusions. No evidence of pulmonary edema. Heart size is normal. The patient is rotated to the upright on today's exam, resulting in distortion of the mediastinal contours and reduced diagnostic sensitivity and specificity for mediastinal pathology. Atherosclerosis in the thoracic aorta.  IMPRESSION: 1. Patchy areas of interstitial prominence throughout the mid to lower lungs bilaterally. Whether or not this is chronic or indicative of an acute process such is infection or sequela of mild aspiration is uncertain. 2. Linear opacity in the periphery of the low upper left hemithorax may reflect scarring, atelectasis or pleural plaque. 3. Atherosclerosis.   Electronically Signed   By: Trudie Reedaniel  Entrikin M.D.   On: 10/20/2013 08:21   Ct Head Wo Contrast  10/19/2013   CLINICAL DATA:  Altered level of consciousness.  Hypertension.  EXAM: CT HEAD WITHOUT CONTRAST  TECHNIQUE: Contiguous axial images were obtained from the base of the skull through the vertex without intravenous contrast.  COMPARISON:  None.  FINDINGS: No intracranial hemorrhage.  Moderate small vessel disease type changes.  Remote small frontal lobe infarcts and right thalamic infarct. No CT evidence of  large acute infarct. If small/medium size infarct is of high clinical concern, MR can be obtained for further delineation.  Atrophy. Ventricular prominence may be related to atrophy. Difficult to exclude a very mild component of hydrocephalus.  No intracranial mass lesion noted on this unenhanced exam.  Vascular calcifications.  IMPRESSION: No intracranial hemorrhage.  Moderate small vessel disease type changes.  Remote small frontal lobe infarcts and right thalamic infarct. No CT evidence of large acute infarct.  Atrophy. Ventricular prominence may be related to atrophy. Difficult to exclude a very mild component of hydrocephalus.  Please see above.   Electronically Signed   By: Bridgett LarssonSteve  Olson M.D.   On: 10/19/2013 17:43   Mr Brain Wo Contrast  10/20/2013   CLINICAL DATA:  80 year old male with right lower extremity weakness and confusion. Hypertension and prior stroke. Initial encounter.  EXAM: MRI HEAD WITHOUT CONTRAST  TECHNIQUE: Multiplanar, multiecho pulse sequences of the brain and surrounding structures were obtained without intravenous contrast.  COMPARISON:  Head CT without contrast 10/19/2013.  FINDINGS: Study is intermittently degraded by motion artifact despite repeated imaging attempts.  Cerebral volume loss, appears to disproportionally affect the parietal lobes.  No restricted diffusion or evidence of acute infarction. Intracranial artery dolichoectasia. Dominant, dolichoectatic distal left vertebral artery. Major intracranial vascular flow voids are preserved.  Patchy and confluent bilateral cerebral white matter T2 and FLAIR hyperintensity. Prominent ventricles felt to be ex vacuo in nature. No midline shift, mass effect, or evidence of intracranial mass lesion. Chronic hemorrhage near the right motor strip (series 8, image 19) with otherwise subtle encephalomalacia. Chronic deep gray matter lacunar infarcts, most notably involving the right thalamus.  Visualized orbit soft tissues are within  normal limits. Minor paranasal sinus mucosal thickening. Mastoids are clear. Visible internal auditory structures appear normal. Negative pituitary and cervicomedullary junction. Normal bone marrow signal. Negative visualized cervical spine. Visualized scalp soft tissues are within normal limits.  IMPRESSION: 1.  No acute intracranial abnormality. 2. Chronic ischemic disease (mostly small vessel changes) and cerebral volume loss.   Electronically Signed   By: Augusto Gamble M.D.   On: 10/20/2013 09:27   US Carotid Duplex Bilateral  10/20/2013   CLINICAL DATA:  Cerebral infarcts, hypertension.  EXAM: BILATERAL CAROTID DUPLEX ULTRASOUND  TECHNIQUE: Wallace Cullens scale imaging, color Doppler and duplex ultrasound were performed of bilateral carotid and vertebral arteries in the neck.  COMPARISON:  None.  FINDINGS: Criteria: Quantification of carotid stenosis is based on velocity parameters that correlate the residual internal carotid diameter with NASCET-based stenosis levels, using the diameter of the distal internal carotid lumen as the denominator for stenosis measurement.  The following velocity measurements were obtained:  RIGHT  ICA:  169/14 cm/sec  CCA:  83/10 cm/sec  SYSTOLIC ICA/CCA RATIO:  2.0  DIASTOLIC ICA/CCA RATIO:  1.4  ECA:  106 cm/sec  LEFT  ICA:  63/9 cm/sec  CCA:  76/7 cm/sec  SYSTOLIC ICA/CCA RATIO:  0.8  DIASTOLIC ICA/CCA RATIO:  1.2  ECA:  85 cm/sec  RIGHT CAROTID ARTERY: There is a moderate amount of partially calcified plaque at the level of the distal bulb and proximal ICA. Estimated proximal right ICA stenosis is 50- 69%.  RIGHT VERTEBRAL ARTERY: Antegrade flow with normal waveform and velocity.  LEFT CAROTID ARTERY: Moderate amount of partially calcified plaque is present predominantly at the level of the distal common carotid artery and carotid bulb. Plaque does extend into the ICA. Estimated proximal left ICA stenosis is less than 50%.  LEFT VERTEBRAL ARTERY: Antegrade flow with normal waveform and  velocity.  IMPRESSION: Moderate atherosclerotic plaque at both carotid bifurcations. Estimated right ICA stenosis is 50- 69% and left ICA stenosis is less than 50%.   Electronically Signed   By: Irish Lack M.D.   On: 10/20/2013 08:21   Dg Chest Portable 1 View  10/19/2013   CLINICAL DATA:  Elevated blood pressure.  EXAM: PORTABLE CHEST - 1 VIEW  COMPARISON:  None.  FINDINGS: Remote right rib trauma. Patient rotated right. Normal heart size and mediastinal contours for age. Right costophrenic angle blunting could relate to minimal fluid or pleural thickening. No pneumothorax. Lower lobe predominant interstitial thickening is nonspecific, especially in this age group. Slightly greater on the left. No lobar consolidation. An irregular opacity projecting over the left upper lung may represent a calcified pleural plaque.  IMPRESSION: Lower lobe predominant interstitial thickening is nonspecific in this age group. Slightly greater on the left. This could represent the sequelae of chronic bronchitis or smoking. Difficult to exclude early acute infectious process, given absence of prior radiographs. Consider further evaluation with PA and lateral radiograph and consideration of plain film followup.  Small right pleural  effusion versus pleural thickening.  Suspicion of calcified pleural plaque within the upper left hemi thorax. Considerations include prior complex left pleural effusion or even asbestos related pleural disease. This could also be further characterized with PA and lateral radiographs.   Electronically Signed   By: Jeronimo Greaves M.D.   On: 10/19/2013 15:13    Scheduled Meds: . aspirin EC  325 mg Oral Daily  . azithromycin  500 mg Intravenous Q24H  . cefTRIAXone (ROCEPHIN)  IV  1 g Intravenous Q24H  . donepezil  5 mg Oral QHS  . enoxaparin (LOVENOX) injection  40 mg Subcutaneous Q24H  . folic acid  1 mg Oral Daily  . multivitamin with minerals  1 tablet Oral Daily  . sodium chloride  3 mL  Intravenous Q12H  . thiamine  100 mg Oral Daily   Continuous Infusions:   Principal Problem:   Acute encephalopathy Active Problems:   Weakness   Right leg weakness   CAP (community acquired pneumonia)   Urinary incontinence   Abnormal CXR    Time spent: 30 minutes    [facility] M  [facility] Pager [phone]. If 7PM-7AM, please contact night-coverage at [email or web address], password [facility] 10/20/2013, 11:39 AM  LOS: 1 day

## 2013-10-20 NOTE — Progress Notes (Signed)
ANTIBIOTIC CONSULT NOTE - INITIAL  Pharmacy Consult for Renal Adjustment of Antibiotics Indication: pneumonia  No Known Allergies  Patient Measurements: Height: 5\' 3"  (160 cm) Weight: 141 lb 8.6 oz (64.2 kg) IBW/kg (Calculated) : 56.9  Vital Signs: Temp: 98.1 F (36.7 C) (03/02 0404) Temp src: Oral (03/02 0404) BP: 141/77 mmHg (03/02 0404) Pulse Rate: 92 (03/02 0404) Intake/Output from previous day: 03/01 0701 - 03/02 0700 In: -  Out: 850 [Urine:850] Intake/Output from this shift: Total I/O In: -  Out: 200 [Urine:200]  Labs:  Recent Labs  10/19/13 1518 10/20/13 0648  WBC 11.2* 8.2  HGB 13.7 12.4*  PLT 188 162  CREATININE 1.01 0.85   Estimated Creatinine Clearance: 54.9 ml/min (by C-G formula based on Cr of 0.85). No results found for this basename: VANCOTROUGH, VANCOPEAK, VANCORANDOM, GENTTROUGH, GENTPEAK, GENTRANDOM, TOBRATROUGH, TOBRAPEAK, TOBRARND, AMIKACINPEAK, AMIKACINTROU, AMIKACIN,  in the last 72 hours   Microbiology: No results found for this or any previous visit (from the past 720 hour(s)).  Medical History: Past Medical History  Diagnosis Date  . Hypertension     Medications:  Scheduled:  . aspirin EC  325 mg Oral Daily  . azithromycin  500 mg Intravenous Q24H  . cefTRIAXone (ROCEPHIN)  IV  1 g Intravenous Q24H  . donepezil  5 mg Oral QHS  . enoxaparin (LOVENOX) injection  40 mg Subcutaneous Q24H  . folic acid  1 mg Oral Daily  . multivitamin with minerals  1 tablet Oral Daily  . sodium chloride  3 mL Intravenous Q12H  . thiamine  100 mg Oral Daily   Assessment: 80yo male admitted with questionable CAP.  Rocephin and Zithromax have been started.  Estimated Creatinine Clearance: 54.9 ml/min (by C-G formula based on Cr of 0.85). No renal adjustment is needed for these medications  Goal of Therapy:  Eradicate infection.  Plan:  Continue current Rx for now Switch to PO antibiotics when improved/indicated  Jason Pratt, Kron Everton A 10/20/2013,11:00  AM

## 2013-10-20 NOTE — Progress Notes (Signed)
Patient seen, independently examined and chart reviewed. I agree with exam, assessment and plan discussed with Toya SmothersKaren Black, NP.  Ex-wife the bedside. Patient has lived with her for several years now and she has provided care, assistance with ADLs. At baseline he is able to walk and to outside and smoke but dementia has continued to worsen, especially since November. He developed urinary incontinence over the last several weeks and has been increasingly difficult to care for.  Subjective: Patient has no complaints. He is alert and eating  Objective: Afebrile, vital signs stable. No hypoxia. Appears calm and comfortable. Speech fluent and clear. Cardiovascular regular rate and rhythm. Respiratory clear to auscultation bilaterally. No wheezes, rales or rhonchi. Normal respiratory effort. Musculoskeletal grossly normal tone and strength all extremities.  Complete metabolic panel unremarkable. CBC unremarkable. RPR and HIV nonreactive. TSH normal. Hemoglobin A1c normal. Urine drug screen positive for benzodiazepines. MRI brain unremarkable.  MRI of the brain was negative, no evidence of stroke. Significance of the right lower extremity gait abnormality unclear. Further recommendations per neurology. I agree he would definitely benefit from rehabilitation. On further discussion, it appears she has chronic dementia which is advancing, there is no evidence of acute encephalopathy. I doubt pneumonia but a short course of antibiotics will be recommended. He can likely transfer to skilled facility next 48 hours.  Brendia Sacksaniel Goodrich, MD [facility] [phone]

## 2013-10-20 NOTE — Progress Notes (Signed)
*  PRELIMINARY RESULTS* Echocardiogram 2D Echocardiogram has been performed.  Jason Pratt 10/20/2013, 10:05 AM

## 2013-10-20 NOTE — Clinical Social Work Psychosocial (Signed)
Clinical Social Work Department BRIEF PSYCHOSOCIAL ASSESSMENT 10/20/2013  Patient:  Jason Pratt, Jason Pratt     Account Number:  [id number]     Admit date:  10/19/2013  Clinical Social Worker:  Edwyna Shell, [place]  Date/Time:  10/20/2013 03:15 PM  Referred by:  Physician  Date Referred:  10/20/2013 Referred for  SNF Placement   Other Referral:   Interview type:  Patient Other interview type:   Also spoke to ex-wife/caregiver    PSYCHOSOCIAL DATA Living Status:  FAMILY Admitted from facility:   Level of care:   Primary support name:  Moksh Loomer Primary support relationship to patient:  SPOUSE Degree of support available:   Ex wife has cared for patient in her home approx 5 years    CURRENT CONCERNS  Other Concerns:    SOCIAL WORK ASSESSMENT / PLAN CSW met w patient and ex-wife in room, patient has lived w ex wife in duplex lfor approx 5years.  Ex wife brought him to her home after finding him in medical distress w pneumonia and no heat.  Patient has dementia which has been "progressing" - has been resistant to care, has not seen a doctor since 1987.  Has been increasingly confused - mistakes remote for phone for example.  Recieves Medicare and Medicaid, has approx $832/month income.  Walks w a walker but requires 24/7 supervision and care.  Ex wife has been willing to do this up to present, but patient is now agreeable to SNF placment for rehab and appears to understand what this means.  Ex wife concerned that she cannot continue to meet his care needs at home and would like him to go through rehab at discharge.   Assessment/plan status:  Psychosocial Support/Ongoing Assessment of Needs Other assessment/ plan:   Information/referral to community resources:   SNF list    PATIENT'S/FAMILY'S RESPONSE TO PLAN OF CARE: Ex wife expressed caregiver fatigue due to increasing demands of caring for patient w increasing levels of dementia, would like to go to rehab and  regain strength if possible.  Patient expressed willingness to rehab, wants [facility] facility.        Edwyna Shell, LCSW Clinical Social Worker [phone])

## 2013-10-20 NOTE — Evaluation (Signed)
Physical Therapy Evaluation Patient Details Name: Jason Pratt MRN: [id number] DOB: [birth year] Today's Date: 10/20/2013 Time: [id number] PT Time Calculation (min): 35 min  PT Assessment / Plan / Recommendation History of Present Illness  Pt with a hx of old strokes is admitted with acute encephalopathy.  He has had progressive decline in funcutional status over the past year according to his ex-wife (who is his care giver) and became unable to walk yesterday.  Clinical Impression   Pt was seen for evaluation.  He was very pleasant and cooperative, able to follow all directions.  He was oriented to self, place and situation.  He was found to have a significant decrease of coordination in UEs and R LE.  When asked to clap his hands together, he missed his hands altogether the 1st try.  This improved with practice,  His gait is unsteady with steppage pattern of the RLE.  He appears to have increased tone of the RLE evidenced during gait.  He is a very high fall risk and would benefit from SNF at d/c.  Pt is agreeable to this.  He states "I feel like I need a lot of therapy".    PT Assessment  Patient needs continued PT services    Follow Up Recommendations  SNF         Barriers to Discharge Decreased caregiver support ex-wife does not feel that she can continue to provide the 24 hour care that pt requires    Equipment Recommendations  None recommended by PT    Recommendations for Other Services OT consult   Frequency Min 3X/week    Precautions / Restrictions Precautions Precautions: Fall Restrictions Weight Bearing Restrictions: No     Mobility  Bed Mobility Overal bed mobility: Independent Transfers Overall transfer level: Needs assistance Equipment used: Rolling walker (2 wheeled) Transfers: Sit to/from Stand Sit to Stand: Min guard Ambulation/Gait Ambulation/Gait assistance: Min assist Ambulation Distance (Feet): 20 Feet Assistive device: Rolling walker (2 wheeled) Gait  Pattern/deviations: Steppage Gait velocity interpretation: Below normal speed for age/gender General Gait Details: pt appears to have increased tone during gait with steppage of the RLE...this causes diminished balance and pt is extremely fearful of walking         PT Diagnosis: Difficulty walking;Abnormality of gait  PT Problem List: Decreased balance;Decreased mobility;Decreased coordination;Decreased cognition;Impaired tone PT Treatment Interventions: Gait training;Functional mobility training;Therapeutic activities;Balance training     PT Goals(Current goals can be found in the care plan section) Acute Rehab PT Goals Patient Stated Goal: wants to be able to walk better PT Goal Formulation: With patient/family Time For Goal Achievement: 11/03/13 Potential to Achieve Goals: Fair  Visit Information  Last PT Received On: 10/20/13 History of Present Illness: Pt with a hx of old strokes is admitted with acute encephalopathy.  He has had progressive decline in funcutional status over the past year according to his ex-wife (who is his care giver) and became unable to walk yesterday.       Prior Functioning  Home Living Family/patient expects to be discharged to:: Skilled nursing facility Prior Function Level of Independence: Needs assistance Gait / Transfers Assistance Needed: pt had been able to ambulate with a walker and transfer independently ADL's / Homemaking Assistance Needed: assist needed with a sponge bath but able to dress himself Communication Communication: No difficulties    Cognition  Cognition Arousal/Alertness: Awake/alert Behavior During Therapy: WFL for tasks assessed/performed Overall Cognitive Status: History of cognitive impairments - at baseline    Extremity/Trunk Assessment Lower  Extremity Assessment Lower Extremity Assessment: RLE deficits/detail RLE Deficits / Details: deficit appears to be primarily with coordination, very pronounced during  gait...strength is WNL RLE Coordination: decreased gross motor Cervical / Trunk Assessment Cervical / Trunk Assessment: Kyphotic   Balance Balance Overall balance assessment: Needs assistance Sitting-balance support: Feet supported;No upper extremity supported Sitting balance-Leahy Scale: Good Standing balance support: Bilateral upper extremity supported Standing balance-Leahy Scale: Poor  End of Session PT - End of Session Equipment Utilized During Treatment: Gait belt Activity Tolerance: Patient tolerated treatment well Patient left: in chair;with call bell/phone within reach;with chair alarm set Nurse Communication: Mobility status  GP     Konrad Penta 10/20/2013, 11:27 AM

## 2013-10-20 NOTE — Progress Notes (Signed)
Utilization Review Complete  

## 2013-10-20 NOTE — Consult Note (Signed)
Jason A. Merlene Laughter, MD     [email or web address]          Jason Pratt is an 80 y.o. male.   ASSESSMENT/PLAN: 1. Progressive dementia likely of the Alzheimer's type. Dementia labs will be obtained. The patient will be started on Aricept.  2. Multifactorial gait impairment. The patient's brain MRI will be reviewed when available.  The patient 80 year old white male who presents with 3 year history of progressive deterioration in overall function. The last year has had a significant progressive deterioration in his symptoms however. The history is obtained from his ex-wife. She reports that he has had progressively worsening cognition and gait impairment over the last year. He has required help with a lot of his ADLs. He has not gone to see a doctor in over 3 years. She reports that he has not been well. No complaints are reported. The review of systems is limited due to the impaired cognition.  GENERAL: The patient is in no acute distress.  HEENT: Supple. Atraumatic normocephalic.   ABDOMEN: soft  EXTREMITIES: No edema   BACK: Normal.  SKIN: Normal by inspection.    MENTAL STATUS: The patient is awake and alert. He is globally confused and disoriented. He has little spontaneous speech and follow commands but with much prompting.  CRANIAL NERVES: Pupils are equal, round and reactive to light and accommodation; extra ocular movements are full, there is no significant nystagmus; visual fields are full; upper and lower facial muscles are normal in strength and symmetric, there is no flattening of the nasolabial folds; tongue is midline; uvula is midline; shoulder elevation is normal.  MOTOR: Normal tone, bulk and strength; no pronator drift.  COORDINATION: Left finger to nose is normal, right finger to nose is normal, No rest tremor; no intention tremor; no postural tremor; no bradykinesia.  REFLEXES: Deep tendon reflexes are symmetrical but brisk. Babinski  reflexes are flexor bilaterally.   SENSATION: Normal to pain.     Past Medical History  Diagnosis Date  . Hypertension     Past Surgical History  Procedure Laterality Date  . Foot fracture surgery Left 1985    Family History  Problem Relation Age of Onset  . Lung cancer Sister     Social History:  reports that he has been smoking Cigarettes.  He has a 1.25 pack-year smoking history. He does not have any smokeless tobacco history on file. He reports that he does not drink alcohol or use illicit drugs.  Allergies: No Known Allergies  Medications: Prior to Admission medications   Not on File    Scheduled Meds: . aspirin EC  325 mg Oral Daily  . azithromycin  500 mg Intravenous Q24H  . cefTRIAXone (ROCEPHIN)  IV  1 g Intravenous Q24H  . enoxaparin (LOVENOX) injection  40 mg Subcutaneous Q24H  . folic acid  1 mg Oral Daily  . multivitamin with minerals  1 tablet Oral Daily  . sodium chloride  3 mL Intravenous Q12H  . thiamine  100 mg Intravenous Daily   Continuous Infusions:  PRN Meds:.acetaminophen, acetaminophen, albuterol, ondansetron (ZOFRAN) IV, ondansetron   Blood pressure 141/77, pulse 92, temperature 98.1 F (36.7 C), temperature source Oral, resp. rate 20, height 5' 3"  (1.6 m), weight 64.2 kg (141 lb 8.6 oz), SpO2 94.00%.   Results for orders placed during the hospital encounter of 10/19/13 (from the past 48 hour(s))  BASIC METABOLIC PANEL     Status: Abnormal   Collection Time  10/19/13  3:18 PM      Result Value Ref Range   Sodium 137  137 - 147 mEq/L   Potassium 3.6 (*) 3.7 - 5.3 mEq/L   Chloride 98  96 - 112 mEq/L   CO2 29  19 - 32 mEq/L   Glucose, Bld 103 (*) 70 - 99 mg/dL   BUN 11  6 - 23 mg/dL   Creatinine, Ser 1.01  0.50 - 1.35 mg/dL   Calcium 8.9  8.4 - 10.5 mg/dL   GFR calc non Af Amer 68 (*) >90 mL/min   GFR calc Af Amer 78 (*) >90 mL/min   Comment: (NOTE)     The eGFR has been calculated using the CKD EPI equation.     This  calculation has not been validated in all clinical situations.     eGFR's persistently <90 mL/min signify possible Chronic Kidney     Disease.  CBC WITH DIFFERENTIAL     Status: Abnormal   Collection Time    10/19/13  3:18 PM      Result Value Ref Range   WBC 11.2 (*) 4.0 - 10.5 K/uL   RBC 4.61  4.22 - 5.81 MIL/uL   Hemoglobin 13.7  13.0 - 17.0 g/dL   HCT 40.7  39.0 - 52.0 %   MCV 88.3  78.0 - 100.0 fL   MCH 29.7  26.0 - 34.0 pg   MCHC 33.7  30.0 - 36.0 g/dL   RDW 13.4  11.5 - 15.5 %   Platelets 188  150 - 400 K/uL   Neutrophils Relative % 86 (*) 43 - 77 %   Neutro Abs 9.7 (*) 1.7 - 7.7 K/uL   Lymphocytes Relative 6 (*) 12 - 46 %   Lymphs Abs 0.7  0.7 - 4.0 K/uL   Monocytes Relative 7  3 - 12 %   Monocytes Absolute 0.8  0.1 - 1.0 K/uL   Eosinophils Relative 1  0 - 5 %   Eosinophils Absolute 0.1  0.0 - 0.7 K/uL   Basophils Relative 0  0 - 1 %   Basophils Absolute 0.0  0.0 - 0.1 K/uL  MAGNESIUM     Status: None   Collection Time    10/19/13  3:18 PM      Result Value Ref Range   Magnesium 1.9  1.5 - 2.5 mg/dL  URINALYSIS, ROUTINE W REFLEX MICROSCOPIC     Status: Abnormal   Collection Time    10/19/13  4:50 PM      Result Value Ref Range   Color, Urine AMBER (*) YELLOW   Comment: BIOCHEMICALS MAY BE AFFECTED BY COLOR   APPearance CLEAR  CLEAR   Specific Gravity, Urine 1.020  1.005 - 1.030   pH 6.0  5.0 - 8.0   Glucose, UA NEGATIVE  NEGATIVE mg/dL   Hgb urine dipstick NEGATIVE  NEGATIVE   Bilirubin Urine NEGATIVE  NEGATIVE   Ketones, ur TRACE (*) NEGATIVE mg/dL   Protein, ur NEGATIVE  NEGATIVE mg/dL   Urobilinogen, UA 1.0  0.0 - 1.0 mg/dL   Nitrite NEGATIVE  NEGATIVE   Leukocytes, UA NEGATIVE  NEGATIVE   Comment: MICROSCOPIC NOT DONE ON URINES WITH NEGATIVE PROTEIN, BLOOD, LEUKOCYTES, NITRITE, OR GLUCOSE <1000 mg/dL.  URINE RAPID DRUG SCREEN (HOSP PERFORMED)     Status: Abnormal   Collection Time    10/20/13  3:00 AM      Result Value Ref Range   Opiates NONE DETECTED   NONE  DETECTED   Cocaine NONE DETECTED  NONE DETECTED   Benzodiazepines POSITIVE (*) NONE DETECTED   Amphetamines NONE DETECTED  NONE DETECTED   Tetrahydrocannabinol NONE DETECTED  NONE DETECTED   Barbiturates NONE DETECTED  NONE DETECTED   Comment:            DRUG SCREEN FOR MEDICAL PURPOSES     ONLY.  IF CONFIRMATION IS NEEDED     FOR ANY PURPOSE, NOTIFY LAB     WITHIN 5 DAYS.                LOWEST DETECTABLE LIMITS     FOR URINE DRUG SCREEN     Drug Class       Cutoff (ng/mL)     Amphetamine      1000     Barbiturate      200     Benzodiazepine   270     Tricyclics       350     Opiates          300     Cocaine          300     THC              50  COMPREHENSIVE METABOLIC PANEL     Status: Abnormal   Collection Time    10/20/13  6:48 AM      Result Value Ref Range   Sodium 141  137 - 147 mEq/L   Potassium 3.9  3.7 - 5.3 mEq/L   Chloride 104  96 - 112 mEq/L   CO2 28  19 - 32 mEq/L   Glucose, Bld 92  70 - 99 mg/dL   BUN 8  6 - 23 mg/dL   Creatinine, Ser 0.85  0.50 - 1.35 mg/dL   Calcium 8.8  8.4 - 10.5 mg/dL   Total Protein 6.9  6.0 - 8.3 g/dL   Albumin 3.1 (*) 3.5 - 5.2 g/dL   AST 15  0 - 37 U/L   ALT 10  0 - 53 U/L   Alkaline Phosphatase 105  39 - 117 U/L   Total Bilirubin 0.8  0.3 - 1.2 mg/dL   GFR calc non Af Amer 80 (*) >90 mL/min   GFR calc Af Amer >90  >90 mL/min   Comment: (NOTE)     The eGFR has been calculated using the CKD EPI equation.     This calculation has not been validated in all clinical situations.     eGFR's persistently <90 mL/min signify possible Chronic Kidney     Disease.  CBC     Status: Abnormal   Collection Time    10/20/13  6:48 AM      Result Value Ref Range   WBC 8.2  4.0 - 10.5 K/uL   RBC 4.21 (*) 4.22 - 5.81 MIL/uL   Hemoglobin 12.4 (*) 13.0 - 17.0 g/dL   HCT 37.0 (*) 39.0 - 52.0 %   MCV 87.9  78.0 - 100.0 fL   MCH 29.5  26.0 - 34.0 pg   MCHC 33.5  30.0 - 36.0 g/dL   RDW 13.3  11.5 - 15.5 %   Platelets 162  150 - 400 K/uL    PROTIME-INR     Status: None   Collection Time    10/20/13  6:48 AM      Result Value Ref Range   Prothrombin Time 14.1  11.6 - 15.2 seconds   INR 1.11  0.00 - 1.49    Dg Chest 2 View  10/20/2013   CLINICAL DATA:  Pneumonia.  EXAM: CHEST  2 VIEW  COMPARISON:  Chest x-ray 10/19/2013.  FINDINGS: Linear opacities in the periphery of the upper left hemithorax are again noted, favored to represent areas of subsegmental atelectasis, scarring or potential pleural plaques. Patchy areas of interstitial prominence are noted throughout the mid to lower lungs bilaterally. No frank consolidative airspace disease. No definite pleural effusions. No evidence of pulmonary edema. Heart size is normal. The patient is rotated to the upright on today's exam, resulting in distortion of the mediastinal contours and reduced diagnostic sensitivity and specificity for mediastinal pathology. Atherosclerosis in the thoracic aorta.  IMPRESSION: 1. Patchy areas of interstitial prominence throughout the mid to lower lungs bilaterally. Whether or not this is chronic or indicative of an acute process such is infection or sequela of mild aspiration is uncertain. 2. Linear opacity in the periphery of the low upper left hemithorax may reflect scarring, atelectasis or pleural plaque. 3. Atherosclerosis.   Electronically Signed   By: Vinnie Langton M.D.   On: 10/20/2013 08:21   Ct Head Wo Contrast  10/19/2013   CLINICAL DATA:  Altered level of consciousness.  Hypertension.  EXAM: CT HEAD WITHOUT CONTRAST  TECHNIQUE: Contiguous axial images were obtained from the base of the skull through the vertex without intravenous contrast.  COMPARISON:  None.  FINDINGS: No intracranial hemorrhage.  Moderate small vessel disease type changes.  Remote small frontal lobe infarcts and right thalamic infarct. No CT evidence of large acute infarct. If small/medium size infarct is of high clinical concern, MR can be obtained for further delineation.   Atrophy. Ventricular prominence may be related to atrophy. Difficult to exclude a very mild component of hydrocephalus.  No intracranial mass lesion noted on this unenhanced exam.  Vascular calcifications.  IMPRESSION: No intracranial hemorrhage.  Moderate small vessel disease type changes.  Remote small frontal lobe infarcts and right thalamic infarct. No CT evidence of large acute infarct.  Atrophy. Ventricular prominence may be related to atrophy. Difficult to exclude a very mild component of hydrocephalus.  Please see above.   Electronically Signed   By: Chauncey Cruel M.D.   On: 10/19/2013 17:43   US Carotid Duplex Bilateral  10/20/2013   CLINICAL DATA:  Cerebral infarcts, hypertension.  EXAM: BILATERAL CAROTID DUPLEX ULTRASOUND  TECHNIQUE: Pearline Cables scale imaging, color Doppler and duplex ultrasound were performed of bilateral carotid and vertebral arteries in the neck.  COMPARISON:  None.  FINDINGS: Criteria: Quantification of carotid stenosis is based on velocity parameters that correlate the residual internal carotid diameter with NASCET-based stenosis levels, using the diameter of the distal internal carotid lumen as the denominator for stenosis measurement.  The following velocity measurements were obtained:  RIGHT  ICA:  169/14 cm/sec  CCA:  28/36 cm/sec  SYSTOLIC ICA/CCA RATIO:  2.0  DIASTOLIC ICA/CCA RATIO:  1.4  ECA:  106 cm/sec  LEFT  ICA:  63/9 cm/sec  CCA:  62/9 cm/sec  SYSTOLIC ICA/CCA RATIO:  0.8  DIASTOLIC ICA/CCA RATIO:  1.2  ECA:  85 cm/sec  RIGHT CAROTID ARTERY: There is a moderate amount of partially calcified plaque at the level of the distal bulb and proximal ICA. Estimated proximal right ICA stenosis is 50- 69%.  RIGHT VERTEBRAL ARTERY: Antegrade flow with normal waveform and velocity.  LEFT CAROTID ARTERY: Moderate amount of partially calcified plaque is present predominantly at the level of the distal common carotid artery  and carotid bulb. Plaque does extend into the ICA. Estimated proximal  left ICA stenosis is less than 50%.  LEFT VERTEBRAL ARTERY: Antegrade flow with normal waveform and velocity.  IMPRESSION: Moderate atherosclerotic plaque at both carotid bifurcations. Estimated right ICA stenosis is 50- 69% and left ICA stenosis is less than 50%.   Electronically Signed   By: Aletta Edouard M.D.   On: 10/20/2013 08:21   Dg Chest Portable 1 View  10/19/2013   CLINICAL DATA:  Elevated blood pressure.  EXAM: PORTABLE CHEST - 1 VIEW  COMPARISON:  None.  FINDINGS: Remote right rib trauma. Patient rotated right. Normal heart size and mediastinal contours for age. Right costophrenic angle blunting could relate to minimal fluid or pleural thickening. No pneumothorax. Lower lobe predominant interstitial thickening is nonspecific, especially in this age group. Slightly greater on the left. No lobar consolidation. An irregular opacity projecting over the left upper lung may represent a calcified pleural plaque.  IMPRESSION: Lower lobe predominant interstitial thickening is nonspecific in this age group. Slightly greater on the left. This could represent the sequelae of chronic bronchitis or smoking. Difficult to exclude early acute infectious process, given absence of prior radiographs. Consider further evaluation with PA and lateral radiograph and consideration of plain film followup.  Small right pleural effusion versus pleural thickening.  Suspicion of calcified pleural plaque within the upper left hemi thorax. Considerations include prior complex left pleural effusion or even asbestos related pleural disease. This could also be further characterized with PA and lateral radiographs.   Electronically Signed   By: Abigail Miyamoto M.D.   On: 10/19/2013 15:13        Dorenda Pfannenstiel A. Merlene Pratt, M.D.  Diplomate, Tax adviser of Psychiatry and Neurology ( Neurology). 10/20/2013, 8:53 AM

## 2013-10-20 NOTE — Progress Notes (Signed)
Clinical Social Work Department CLINICAL SOCIAL WORK PLACEMENT NOTE 10/20/2013  Patient:  Jason RevereSMITH,[name]  Account Number:  [id number] Admit date:  10/19/2013  Clinical Social Worker:  Samuella BruinKRISTIN Larya Charpentier, Theresia MajorsLCSWA  Date/time:  10/20/2013 04:17 PM  Clinical Social Work is seeking post-discharge placement for this patient at the following level of care:   SKILLED NURSING   (*CSW will update this form in Epic as items are completed)   10/20/2013  Patient/family provided with [name] Department of Clinical Social Work's list of facilities offering this level of care within the geographic area requested by the patient (or if unable, by the patient's family).  10/20/2013  Patient/family informed of their freedom to choose among providers that offer the needed level of care, that participate in Medicare, Medicaid or managed care program needed by the patient, have an available bed and are willing to accept the patient.  10/20/2013  Patient/family informed of [facility]' ownership interest in [facility], as well as of the fact that they are under no obligation to receive care at this facility.  PASARR submitted to EDS on 10/20/2013 PASARR number received from EDS on 10/20/2013  FL2 transmitted to all facilities in geographic area requested by pt/family on  10/20/2013 FL2 transmitted to all facilities within larger geographic area on   Patient informed that his/her managed care company has contracts with or will negotiate with  certain facilities, including the following:     Patient/family informed of bed offers received:   Patient chooses bed at  Physician recommends and patient chooses bed at    Patient to be transferred to  on   Patient to be transferred to facility by   The following physician request were entered in Epic:   Additional Comments:  Samuella BruinKristin Amelia Burgard, MSW, LCSWA Clinical Social Worker [facility] [phone]

## 2013-10-20 NOTE — Progress Notes (Signed)
EEG Completed; Results Pending  

## 2013-10-20 NOTE — Care Management Note (Signed)
    Page 1 of 1   10/20/2013     4:42:06 PM   CARE MANAGEMENT NOTE 10/20/2013  Patient:  Sharmon RevereSMITH,Kiet   Account Number:  [id number]  Date Initiated:  10/20/2013  Documentation initiated by:  Rosemary HolmsOBSON,Min Collymore  Subjective/Objective Assessment:   Pt admitted from home where he lives with his ex-wife and grandson. Ex-wife states she no longer can care for him at home. CSW assisting in placement options.     Action/Plan:   Anticipated DC Date:     Anticipated DC Plan:  SKILLED NURSING FACILITY  In-house referral  Clinical Social Worker      DC Planning Services  CM consult      Choice offered to / List presented to:             Status of service:  Completed, signed off Medicare Important Message given?   (If response is "NO", the following Medicare IM given date fields will be blank) Date Medicare IM given:   Date Additional Medicare IM given:    Discharge Disposition:    Per UR Regulation:    If discussed at Long Length of Stay Meetings, dates discussed:    Comments:  10/20/13 Rosemary HolmsAmy Lanice Folden RN BSN CM

## 2013-10-20 NOTE — Progress Notes (Signed)
Patient complained of burning while attempting to void. Small drops of blood noted from penis. Voided with no difficulty. Urine does not appear bloody. Will not wear condom catheter.

## 2013-10-21 ENCOUNTER — Inpatient Hospital Stay (HOSPITAL_COMMUNITY): Payer: Medicare Other

## 2013-10-21 DIAGNOSIS — R5383 Other fatigue: Secondary | ICD-10-CM

## 2013-10-21 DIAGNOSIS — E440 Moderate protein-calorie malnutrition: Secondary | ICD-10-CM | POA: Diagnosis present

## 2013-10-21 DIAGNOSIS — R5381 Other malaise: Secondary | ICD-10-CM

## 2013-10-21 LAB — LEGIONELLA ANTIGEN, URINE: LEGIONELLA ANTIGEN, URINE: NEGATIVE

## 2013-10-21 MED ORDER — CLONIDINE HCL 0.1 MG PO TABS
0.1000 mg | ORAL_TABLET | Freq: Four times a day (QID) | ORAL | Status: DC | PRN
  Administered 2013-10-21: 0.1 mg via ORAL
  Filled 2013-10-21: qty 1

## 2013-10-21 MED ORDER — LEVOFLOXACIN 750 MG PO TABS
750.0000 mg | ORAL_TABLET | Freq: Every day | ORAL | Status: DC
  Administered 2013-10-21 – 2013-10-22 (×2): 750 mg via ORAL
  Filled 2013-10-21 (×2): qty 1

## 2013-10-21 MED ORDER — AZITHROMYCIN 250 MG PO TABS
500.0000 mg | ORAL_TABLET | Freq: Every day | ORAL | Status: DC
  Administered 2013-10-21: 500 mg via ORAL
  Filled 2013-10-21: qty 2

## 2013-10-21 NOTE — Plan of Care (Addendum)
Pt complaining of "messed up" vision.  He said the room was upside down and he was afraid to fall.  Pt kept asking me to help him down.  Pt showed no s/sx of stroke after stroke assessment and vitals were normal except BP was high (185/89).  Dr. Mahala MenghiniSamtani was notified and came to pt room to also assess.  Ordered Stat 0.1 PO Clonodine and continue to monitor.  Pt was also sent down for MRI.  As pt was leaving for MRI, he indicated that his vision was now "normal." (1730). Rechecked BP was 155/75 (After returned from MRI - 1807).  Dr. Informed.

## 2013-10-21 NOTE — Clinical Social Work Placement (Signed)
Clinical Social Work Department CLINICAL SOCIAL WORK PLACEMENT NOTE 10/21/2013  Patient:  Jason Pratt,Jason Pratt  Account Number:  [id number] Admit date:  10/19/2013  Clinical Social Worker:  Samuella BruinKRISTIN DRINKARD, Theresia MajorsLCSWA  Date/time:  10/20/2013 04:17 PM  Clinical Social Work is seeking post-discharge placement for this patient at the following level of care:   SKILLED NURSING   (*CSW will update this form in Epic as items are completed)   10/20/2013  [facility]family provided with [name] of Clinical Social Work's list of facilities offering this level of care within the geographic area requested by the patient (or if unable, by the patient's family).  10/20/2013  Patient/family informed of their freedom to choose among providers that offer the needed level of care, that participate in Medicare, Medicaid or managed care program needed by the patient, have an available bed and are willing to accept the patient.  10/20/2013  Patient/family informed of [facility]' ownership interest in [facility], as well as of the fact that they are under no obligation to receive care at this facility.  PASARR submitted to EDS on 10/20/2013 PASARR number received from EDS on 10/20/2013  FL2 transmitted to all facilities in geographic area requested by pt/family on  10/20/2013 FL2 transmitted to all facilities within larger geographic area on   Patient informed that his/her managed care company has contracts with or will negotiate with  certain facilities, including the following:     Patient/family informed of bed offers received:  10/21/2013 [facility] Physician recommends and patient [facility]  Patient to be transferred to  on   Patient to be transferred to facility by   The following physician request were entered in Epic:   Additional Comments:  Derenda FennelKara Ardian Haberland, LCSW [phone]

## 2013-10-21 NOTE — Progress Notes (Signed)
PHARMACIST - PHYSICIAN COMMUNICATION DR:   [facility] CONCERNING: Antibiotic IV to Oral Route Change Policy  RECOMMENDATION: This patient is receiving Zithromax by the intravenous route.  Based on criteria approved by the Pharmacy and Therapeutics Committee, the antibiotic(s) is/are being converted to the equivalent oral dose form(s).   DESCRIPTION: These criteria include:  Patient being treated for a respiratory tract infection, urinary tract infection, cellulitis or clostridium difficile associated diarrhea if on metronidazole  The patient is not neutropenic and does not exhibit a GI malabsorption state  The patient is eating (either orally or via tube) and/or has been taking other orally administered medications for a least 24 hours  The patient is improving clinically and has a Tmax < 100.5  If you have questions about this conversion, please contact the Pharmacy Department  [x]   [phone]( [phone] )  [facility] []   [phone]( [phone] )  [name]  []   [phone]( [phone] )  [facility] []   [phone]( [phone] )  [facility]  Plan: Zithromax and Rocephin dose stable. Sign off.  Mady GemmaHayes, Leela Vanbrocklin [facility]  10/21/2013 10:10 AM

## 2013-10-21 NOTE — Progress Notes (Signed)
[facility] PROGRESS NOTE  Jason Pratt ZOX:[id number] DOB: 04-Aug-1930 DOA: 10/19/2013 PCP: No PCP Per Patient  Assessment/Plan: #1 right lower extremity weakness, and unsteady gait: MRI brain without acute abnormality. No evidence of stroke.  Carotid Dopplers with moderate atherosclerotic plaque at both carotid bifurcations. Estimated right ICA stenosis is 50- 69% and left ICA stenosis is less than 50. Continue aspirin. B12, RPR, TSH, Homocysteine levels all within the limits of normal.  PT recommending snf. CT scan of the head raised the possibility of hydrocephalus, but was thought to be mild and possibly related to atrophy. We'll wait for neurological opinion.. PT recommending snf. Await bed.    #2 acute encephalopathy: Clinical hx indicates going on for a long period of time. Neuro opines likely related to dementia of Alzheimer's type. TSH, RPR, B12 within the limits of normal. EEG pending. Urine drug screen +benzo. Appreciate Dr. Gerilyn Pilgrim assistance. Aricept per neurology.  #3 questionable community acquired pneumonia: Continue with ceftriaxone and azithromycin day #3. Strep pneumo urine antigen negative and Legionella antigen pending. He is afebrile and non-toxic appearing. Oxygen saturation level >94% on room air.   #4 Abnormal chest x-ray: The possibility was raised about a pleural plaque in the left upper lobe. 2 view chest x-ray yields linear opacity in the periphery of the low upper left hemithorax may reflect scarring, atelectasis or pleural plaque. He is a smoker and so is at risk for malignancy.    Code Status: full Family Communication: ex-wife at bedside Disposition Plan: snf hopefully 24 hours   Consultants:  neurology  Procedures: Carotid duplex with Moderate atherosclerotic plaque at both carotid bifurcations. Estimated right ICA stenosis is 50- 69% and left ICA stenosis is  less than 50%.   Antibiotics: Azithromycin 10/19/13>>  Rocephin  10/19/13>>>   HPI/Subjective: Sitting up in bed watching tv. Reports "feeling better today". Denies pain/discomfort  Objective: Filed Vitals:   10/20/13 2141  BP: 148/74  Pulse: 95  Temp: 97.4 F (36.3 C)  Resp: 19    Intake/Output Summary (Last 24 hours) at 10/21/13 1037 Last data filed at 10/21/13 0719  Gross per 24 hour  Intake    910 ml  Output   1450 ml  Net   -540 ml   Filed Weights   10/19/13 1440 10/19/13 1900  Weight: 70.308 kg (155 lb) 64.2 kg (141 lb 8.6 oz)    Exam:   General:  Well nourished NAD  Cardiovascular: S1 and S2. No m/g/r no LE edema  Respiratory: normal effort BS are clear bilaterally, somewhat coarse,  no wheeze  Abdomen: round soft +BS throughout, non-tender to palpation.   Musculoskeletal: moves all extremities. Bilateral LE strength 5/5.   Neuro: oriented to self. Follow commands. Responds appropriately to questions.    Data Reviewed: Basic Metabolic Panel:  Recent Labs Lab 10/19/13 1518 10/20/13 0648  NA 137 141  K 3.6* 3.9  CL 98 104  CO2 29 28  GLUCOSE 103* 92  BUN 11 8  CREATININE 1.01 0.85  CALCIUM 8.9 8.8  MG 1.9  --    Liver Function Tests:  Recent Labs Lab 10/20/13 0648  AST 15  ALT 10  ALKPHOS 105  BILITOT 0.8  PROT 6.9  ALBUMIN 3.1*   No results found for this basename: LIPASE, AMYLASE,  in the last 168 hours No results found for this basename: AMMONIA,  in the last 168 hours CBC:  Recent Labs Lab 10/19/13 1518 10/20/13 0648  WBC 11.2* 8.2  NEUTROABS 9.7*  --  HGB 13.7 12.4*  HCT 40.7 37.0*  MCV 88.3 87.9  PLT 188 162   Cardiac Enzymes: No results found for this basename: CKTOTAL, CKMB, CKMBINDEX, TROPONINI,  in the last 168 hours BNP (last 3 results) No results found for this basename: PROBNP,  in the last 8760 hours CBG: No results found for this basename: GLUCAP,  in the last 168 hours  No results found for this or any previous visit (from the past 240 hour(s)).   Studies: Dg Chest  2 View  10/20/2013   CLINICAL DATA:  Pneumonia.  EXAM: CHEST  2 VIEW  COMPARISON:  Chest x-ray 10/19/2013.  FINDINGS: Linear opacities in the periphery of the upper left hemithorax are again noted, favored to represent areas of subsegmental atelectasis, scarring or potential pleural plaques. Patchy areas of interstitial prominence are noted throughout the mid to lower lungs bilaterally. No frank consolidative airspace disease. No definite pleural effusions. No evidence of pulmonary edema. Heart size is normal. The patient is rotated to the upright on today's exam, resulting in distortion of the mediastinal contours and reduced diagnostic sensitivity and specificity for mediastinal pathology. Atherosclerosis in the thoracic aorta.  IMPRESSION: 1. Patchy areas of interstitial prominence throughout the mid to lower lungs bilaterally. Whether or not this is chronic or indicative of an acute process such is infection or sequela of mild aspiration is uncertain. 2. Linear opacity in the periphery of the low upper left hemithorax may reflect scarring, atelectasis or pleural plaque. 3. Atherosclerosis.   Electronically Signed   By: Trudie Reed M.D.   On: 10/20/2013 08:21   Ct Head Wo Contrast  10/19/2013   CLINICAL DATA:  Altered level of consciousness.  Hypertension.  EXAM: CT HEAD WITHOUT CONTRAST  TECHNIQUE: Contiguous axial images were obtained from the base of the skull through the vertex without intravenous contrast.  COMPARISON:  None.  FINDINGS: No intracranial hemorrhage.  Moderate small vessel disease type changes.  Remote small frontal lobe infarcts and right thalamic infarct. No CT evidence of large acute infarct. If small/medium size infarct is of high clinical concern, MR can be obtained for further delineation.  Atrophy. Ventricular prominence may be related to atrophy. Difficult to exclude a very mild component of hydrocephalus.  No intracranial mass lesion noted on this unenhanced exam.  Vascular  calcifications.  IMPRESSION: No intracranial hemorrhage.  Moderate small vessel disease type changes.  Remote small frontal lobe infarcts and right thalamic infarct. No CT evidence of large acute infarct.  Atrophy. Ventricular prominence may be related to atrophy. Difficult to exclude a very mild component of hydrocephalus.  Please see above.   Electronically Signed   By: Bridgett Larsson M.D.   On: 10/19/2013 17:43   Mr Brain Wo Contrast  10/20/2013   CLINICAL DATA:  80 year old male with right lower extremity weakness and confusion. Hypertension and prior stroke. Initial encounter.  EXAM: MRI HEAD WITHOUT CONTRAST  TECHNIQUE: Multiplanar, multiecho pulse sequences of the brain and surrounding structures were obtained without intravenous contrast.  COMPARISON:  Head CT without contrast 10/19/2013.  FINDINGS: Study is intermittently degraded by motion artifact despite repeated imaging attempts.  Cerebral volume loss, appears to disproportionally affect the parietal lobes.  No restricted diffusion or evidence of acute infarction. Intracranial artery dolichoectasia. Dominant, dolichoectatic distal left vertebral artery. Major intracranial vascular flow voids are preserved.  Patchy and confluent bilateral cerebral white matter T2 and FLAIR hyperintensity. Prominent ventricles felt to be ex vacuo in nature. No midline shift, mass effect, or  evidence of intracranial mass lesion. Chronic hemorrhage near the right motor strip (series 8, image 19) with otherwise subtle encephalomalacia. Chronic deep gray matter lacunar infarcts, most notably involving the right thalamus.  Visualized orbit soft tissues are within normal limits. Minor paranasal sinus mucosal thickening. Mastoids are clear. Visible internal auditory structures appear normal. Negative pituitary and cervicomedullary junction. Normal bone marrow signal. Negative visualized cervical spine. Visualized scalp soft tissues are within normal limits.  IMPRESSION: 1.  No  acute intracranial abnormality. 2. Chronic ischemic disease (mostly small vessel changes) and cerebral volume loss.   Electronically Signed   By: Augusto GambleLee  Hall M.D.   On: 10/20/2013 09:27   [place] Carotid Duplex Bilateral  10/20/2013   CLINICAL DATA:  Cerebral infarcts, hypertension.  EXAM: BILATERAL CAROTID DUPLEX ULTRASOUND  TECHNIQUE: Wallace CullensGray scale imaging, color Doppler and duplex ultrasound were performed of bilateral carotid and vertebral arteries in the neck.  COMPARISON:  None.  FINDINGS: Criteria: Quantification of carotid stenosis is based on velocity parameters that correlate the residual internal carotid diameter with NASCET-based stenosis levels, using the diameter of the distal internal carotid lumen as the denominator for stenosis measurement.  The following velocity measurements were obtained:  RIGHT  ICA:  169/14 cm/sec  CCA:  83/10 cm/sec  SYSTOLIC ICA/CCA RATIO:  2.0  DIASTOLIC ICA/CCA RATIO:  1.4  ECA:  106 cm/sec  LEFT  ICA:  63/9 cm/sec  CCA:  76/7 cm/sec  SYSTOLIC ICA/CCA RATIO:  0.8  DIASTOLIC ICA/CCA RATIO:  1.2  ECA:  85 cm/sec  RIGHT CAROTID ARTERY: There is a moderate amount of partially calcified plaque at the level of the distal bulb and proximal ICA. Estimated proximal right ICA stenosis is 50- 69%.  RIGHT VERTEBRAL ARTERY: Antegrade flow with normal waveform and velocity.  LEFT CAROTID ARTERY: Moderate amount of partially calcified plaque is present predominantly at the level of the distal common carotid artery and carotid bulb. Plaque does extend into the ICA. Estimated proximal left ICA stenosis is less than 50%.  LEFT VERTEBRAL ARTERY: Antegrade flow with normal waveform and velocity.  IMPRESSION: Moderate atherosclerotic plaque at both carotid bifurcations. Estimated right ICA stenosis is 50- 69% and left ICA stenosis is less than 50%.   Electronically Signed   By: Irish LackGlenn  Yamagata M.D.   On: 10/20/2013 08:21   Dg Chest Portable 1 View  10/19/2013   CLINICAL DATA:  Elevated blood  pressure.  EXAM: PORTABLE CHEST - 1 VIEW  COMPARISON:  None.  FINDINGS: Remote right rib trauma. Patient rotated right. Normal heart size and mediastinal contours for age. Right costophrenic angle blunting could relate to minimal fluid or pleural thickening. No pneumothorax. Lower lobe predominant interstitial thickening is nonspecific, especially in this age group. Slightly greater on the left. No lobar consolidation. An irregular opacity projecting over the left upper lung may represent a calcified pleural plaque.  IMPRESSION: Lower lobe predominant interstitial thickening is nonspecific in this age group. Slightly greater on the left. This could represent the sequelae of chronic bronchitis or smoking. Difficult to exclude early acute infectious process, given absence of prior radiographs. Consider further evaluation with PA and lateral radiograph and consideration of plain film followup.  Small right pleural effusion versus pleural thickening.  Suspicion of calcified pleural plaque within the upper left hemi thorax. Considerations include prior complex left pleural effusion or even asbestos related pleural disease. This could also be further characterized with PA and lateral radiographs.   Electronically Signed   By: Hosie SpangleKyle  Talbot M.D.  On: 10/19/2013 15:13    Scheduled Meds: . aspirin EC  325 mg Oral Daily  . azithromycin  500 mg Oral Daily  . cefTRIAXone (ROCEPHIN)  IV  1 g Intravenous Q24H  . donepezil  5 mg Oral QHS  . enoxaparin (LOVENOX) injection  40 mg Subcutaneous Q24H  . feeding supplement (ENSURE COMPLETE)  237 mL Oral BID BM  . folic acid  1 mg Oral Daily  . multivitamin with minerals  1 tablet Oral Daily  . sodium chloride  3 mL Intravenous Q12H  . thiamine  100 mg Oral Daily   Continuous Infusions:   Principal Problem:   Acute encephalopathy Active Problems:   Weakness   Right leg weakness   CAP (community acquired pneumonia)   Urinary incontinence   Abnormal CXR    Malnutrition of moderate degree    Time spent: 35 mintues    [facility] M  [facility] Pager [phone]. If 7PM-7AM, please contact night-coverage at [email or web address], password [facility] 10/21/2013, 10:37 AM  LOS: 2 days

## 2013-10-21 NOTE — Evaluation (Signed)
Clinical/Bedside Swallow Evaluation Patient Details  Name: Jason Pratt MRN: [id number] Date of Birth: 1930/06/10  Today's Date: 10/21/2013 Time: 1200-1230 SLP Time Calculation (min): 30 min  Past Medical History:  Past Medical History  Diagnosis Date  . Hypertension    Past Surgical History:  Past Surgical History  Procedure Laterality Date  . Foot fracture surgery Left 1985   HPI:  The patient 80 year old white male who presents with 3 year history of progressive deterioration in overall function. The last year has had a significant progressive deterioration in his symptoms however. The history is obtained from his ex-wife. She reports that he has had progressively worsening cognition and gait impairment over the last year. He has required help with a lot of his ADLs. He has not gone to see a doctor in over 3 years. She reports that he has not been well. No complaints are reported. The review of systems is limited due to the impaired cognition.   Assessment / Plan / Recommendation Clinical Impression  Jason Pratt was seen at bedside for clinical swallow evaluation and assessed with the following: thin liquids, puree, mechanical soft, and regular textures. His ex-wife was present and reports that he typically eats soft foods at home, but that his appetite was poor recently. Oral motor examination revealed mild right sided weakness and decreased coordination with oral motor movements, otherwise unremarkable. No signs or symptoms aspiration noted with liquids. Pt is edentulous and demonstrated difficulty masticating and manipulating solids. Meats were pocketed in right buccal cavity and pt needed mod/max cues to clear. He had some difficulty navigating his lunch tray- using his fork to eat a roll, attempting to eat peaches without removing the saran wrap, and fumbling with items on his tray. Will upgrade diet to mechanical soft with pureed meats and continue thin liquids. Pt will need follow up at  receiving facility.    Aspiration Risk  Mild    Diet Recommendation Dysphagia 3 (Mechanical Soft);Dysphagia 1 (Puree);Thin liquid   Liquid Administration via: Cup;Straw Medication Administration: Crushed with puree Supervision: Full supervision/cueing for compensatory strategies Compensations: Slow rate;Small sips/bites;Check for pocketing;Multiple dry swallows after each bite/sip Postural Changes and/or Swallow Maneuvers: Seated upright 90 degrees;Upright 30-60 min after meal    Other  Recommendations Oral Care Recommendations: Oral care BID Other Recommendations: Clarify dietary restrictions   Follow Up Recommendations  Skilled Nursing facility    Frequency and Duration              Swallow Study Prior Functional Status   Pt with dementia, was living at home and cared for by ex-wife    General Date of Onset: 10/19/13 HPI: The patient 80 year old white male who presents with 3 year history of progressive deterioration in overall function. The last year has had a significant progressive deterioration in his symptoms however. The history is obtained from his ex-wife. She reports that he has had progressively worsening cognition and gait impairment over the last year. He has required help with a lot of his ADLs. He has not gone to see a doctor in over 3 years. She reports that he has not been well. No complaints are reported. The review of systems is limited due to the impaired cognition. Type of Study: Bedside swallow evaluation Diet Prior to this Study: Dysphagia 1 (puree);Thin liquids Temperature Spikes Noted: No Respiratory Status: Room air History of Recent Intubation: No Behavior/Cognition: Alert;Cooperative;Requires cueing Oral Cavity - Dentition: Edentulous Self-Feeding Abilities: Able to feed self Patient Positioning: Upright in bed Baseline Vocal  Quality: Clear;Low vocal intensity Volitional Cough: Strong Volitional Swallow: Able to elicit    Oral/Motor/Sensory  Function Overall Oral Motor/Sensory Function: Impaired Labial ROM: Within Functional Limits Labial Symmetry: Within Functional Limits Labial Strength: Within Functional Limits Labial Sensation: Within Functional Limits Lingual ROM: Within Functional Limits (decreased coordination) Lingual Symmetry: Within Functional Limits Lingual Strength: Reduced Lingual Sensation: Within Functional Limits Facial ROM: Within Functional Limits Facial Symmetry:  (mild right droop) Facial Strength: Within Functional Limits Facial Sensation: Within Functional Limits Velum:  (reduced) Mandible: Within Functional Limits   Ice Chips Ice chips: Not tested   Thin Liquid Thin Liquid: Within functional limits Presentation: Self Fed;Straw    Nectar Thick Nectar Thick Liquid: Not tested   Honey Thick Honey Thick Liquid: Not tested   Puree Puree: Within functional limits Presentation: Self Fed;Spoon   Solid       Solid: Impaired Presentation: Self Fed;Spoon Oral Phase Impairments: Reduced lingual movement/coordination;Impaired anterior to posterior transit;Impaired mastication Oral Phase Functional Implications: Right lateral sulci pocketing;Oral residue      Thank you,  Jason Pratt, CCC-SLP [phone]  Jason Pratt 10/21/2013,1:33 PM

## 2013-10-21 NOTE — Progress Notes (Signed)
Patient ID: Jason Pratt, male   DOB: Apr 06, 1930, 80 y.o.   MRN: [id number]  [place] A. Merlene Laughter, MD     [email or web address]          Jason Pratt is an 80 y.o. male.   Assessment/Plan: 1. Progressive dementia likely of the Alzheimer's type. Dementia labs will be obtained. The patient will be started on Aricept.  2. Multifactorial gait impairment. The patient's brain MRI will be reviewed when available.   The patient is essentially unchanged. Again, he presents with a three-year history of progressive cognitive and gait impairment. Symptoms have gotten significantly worse over last year however.   GENERAL: The patient is in no acute distress.  HEENT: Supple. Atraumatic normocephalic.  ABDOMEN: soft  EXTREMITIES: No edema  BACK: Normal.  SKIN: Normal by inspection.  MENTAL STATUS: The patient is awake and alert. He is globally confused and disoriented. He has little spontaneous speech and follow commands but with much prompting.  CRANIAL NERVES: Pupils are equal, round and reactive to light and accommodation; extra ocular movements are full, there is no significant nystagmus; visual fields are full; upper and lower facial muscles are normal in strength and symmetric, there is no flattening of the nasolabial folds; tongue is midline; uvula is midline; shoulder elevation is normal.  MOTOR: Normal tone, bulk and strength; no pronator drift.  COORDINATION: Left finger to nose is normal, right finger to nose is normal, No rest tremor; no intention tremor; no postural tremor; no bradykinesia.  REFLEXES: Deep tendon reflexes are symmetrical but brisk. Babinski reflexes are flexor bilaterally.  SENSATION: Normal to pain.  GAIT: The patient required maximal assistance stand. His stance is wide and his gait is quite shaky and unsteady. There may be some mild spasticity but the wide ataxic nature seems most prominent.  The patient's brain MRI is reviewed in person. There is  pronounced global atrophy with what appears to be more focal left frontal atrophy. There is also atrophy of the cerebellum. 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 There is also cerebral atrophy. There is significant associated ventriculomegaly and the paraventricular leukoencephalopathy that is confluent. There is chronic Hemorrhagic changes involving the right frontal area and also chronic lacunar infarct involving the right thalamic area.      Objective: Vital signs in last 24 hours: Temp:  [97.4 F (36.3 C)-98.2 F (36.8 C)] 97.4 F (36.3 C) (03/02 2141) Pulse Rate:  [90-95] 95 (03/02 2141) Resp:  [19-20] 19 (03/02 2141) BP: (138-148)/(73-74) 148/74 mmHg (03/02 2141) SpO2:  [95 %-96 %] 96 % (03/02 2141)  Intake/Output from previous day: 03/02 0701 - 03/03 0700 In: 910 [P.O.:360; IV Piggyback:550] Out: 1050 [Urine:1050] Intake/Output this shift: Total I/O In: -  Out: 600 [Urine:600] Nutritional status: Dysphagia   Lab Results: Results for orders placed during the hospital encounter of 10/19/13 (from the past 48 hour(s))  BASIC METABOLIC PANEL     Status: Abnormal   Collection Time    10/19/13  3:18 PM      Result Value Ref Range   Sodium 137  137 - 147 mEq/L   Potassium 3.6 (*) 3.7 - 5.3 mEq/L   Chloride 98  96 - 112 mEq/L   CO2 29  19 - 32 mEq/L   Glucose, Bld 103 (*) 70 - 99 mg/dL   BUN 11  6 - 23 mg/dL   Creatinine, Ser 1.01  0.50 - 1.35 mg/dL   Calcium 8.9  8.4 - 10.5 mg/dL   GFR  calc non Af Amer 68 (*) >90 mL/min   GFR calc Af Amer 78 (*) >90 mL/min   Comment: (NOTE)     The eGFR has been calculated using the CKD EPI equation.     This calculation has not been validated in all clinical situations.     eGFR's persistently <90 mL/min signify possible Chronic Kidney     Disease.  CBC WITH DIFFERENTIAL     Status: Abnormal   Collection Time    10/19/13  3:18 PM      Result Value Ref Range   WBC 11.2 (*) 4.0 - 10.5 K/uL   RBC 4.61  4.22 - 5.81 MIL/uL    Hemoglobin 13.7  13.0 - 17.0 g/dL   HCT 40.7  39.0 - 52.0 %   MCV 88.3  78.0 - 100.0 fL   MCH 29.7  26.0 - 34.0 pg   MCHC 33.7  30.0 - 36.0 g/dL   RDW 13.4  11.5 - 15.5 %   Platelets 188  150 - 400 K/uL   Neutrophils Relative % 86 (*) 43 - 77 %   Neutro Abs 9.7 (*) 1.7 - 7.7 K/uL   Lymphocytes Relative 6 (*) 12 - 46 %   Lymphs Abs 0.7  0.7 - 4.0 K/uL   Monocytes Relative 7  3 - 12 %   Monocytes Absolute 0.8  0.1 - 1.0 K/uL   Eosinophils Relative 1  0 - 5 %   Eosinophils Absolute 0.1  0.0 - 0.7 K/uL   Basophils Relative 0  0 - 1 %   Basophils Absolute 0.0  0.0 - 0.1 K/uL  RPR     Status: None   Collection Time    10/19/13  3:18 PM      Result Value Ref Range   RPR NON REACTIVE  NON REACTIVE   Comment: Performed at [facility]  VITAMIN B12     Status: None   Collection Time    10/19/13  3:18 PM      Result Value Ref Range   Vitamin B-12 446  211 - 911 pg/mL   Comment: Performed at [facility]  TSH     Status: None   Collection Time    10/19/13  3:18 PM      Result Value Ref Range   TSH 2.005  0.350 - 4.500 uIU/mL   Comment: Performed at [place]     Status: None   Collection Time    10/19/13  3:18 PM      Result Value Ref Range   Magnesium 1.9  1.5 - 2.5 mg/dL  HEMOGLOBIN A1C     Status: None   Collection Time    10/19/13  3:18 PM      Result Value Ref Range   Hemoglobin A1C 5.4  <5.7 %   Comment: (NOTE)                                                                               According to the ADA Clinical Practice Recommendations for 2011, when     HbA1c is used as a screening test:      >=6.5%  Diagnostic of Diabetes Mellitus               (if abnormal result is confirmed)     5.7-6.4%   Increased risk of developing Diabetes Mellitus     References:Diagnosis and Classification of Diabetes Mellitus,Diabetes     YPPJ,0932,67(TIWPY 1):S62-S69 and Standards of Medical Care in             Diabetes - 2011,Diabetes  Care,2011,34 (Suppl 1):S11-S61.   Mean Plasma Glucose 108  <117 mg/dL   Comment: Performed at [place] (ROUTINE TESTING)     Status: None   Collection Time    10/19/13  3:18 PM      Result Value Ref Range   HIV NON REACTIVE  NON REACTIVE   Comment: Performed at [place]     Status: Abnormal   Collection Time    10/19/13  4:50 PM      Result Value Ref Range   Color, Urine AMBER (*) YELLOW   Comment: BIOCHEMICALS MAY BE AFFECTED BY COLOR   APPearance CLEAR  CLEAR   Specific Gravity, Urine 1.020  1.005 - 1.030   pH 6.0  5.0 - 8.0   Glucose, UA NEGATIVE  NEGATIVE mg/dL   Hgb urine dipstick NEGATIVE  NEGATIVE   Bilirubin Urine NEGATIVE  NEGATIVE   Ketones, ur TRACE (*) NEGATIVE mg/dL   Protein, ur NEGATIVE  NEGATIVE mg/dL   Urobilinogen, UA 1.0  0.0 - 1.0 mg/dL   Nitrite NEGATIVE  NEGATIVE   Leukocytes, UA NEGATIVE  NEGATIVE   Comment: MICROSCOPIC NOT DONE ON URINES WITH NEGATIVE PROTEIN, BLOOD, LEUKOCYTES, NITRITE, OR GLUCOSE <1000 mg/dL.  STREP PNEUMONIAE URINARY ANTIGEN     Status: None   Collection Time    10/20/13  3:00 AM      Result Value Ref Range   Strep Pneumo Urinary Antigen NEGATIVE  NEGATIVE   Comment:            Infection due to S. pneumoniae     cannot be absolutely ruled out     since the antigen present     may be below the detection limit     of the test.     Performed at [facility] ([place]     Status: Abnormal   Collection Time    10/20/13  3:00 AM      Result Value Ref Range   Opiates NONE DETECTED  NONE DETECTED   Cocaine NONE DETECTED  NONE DETECTED   Benzodiazepines POSITIVE (*) NONE DETECTED   Amphetamines NONE DETECTED  NONE DETECTED   Tetrahydrocannabinol NONE DETECTED  NONE DETECTED   Barbiturates NONE DETECTED  NONE DETECTED   Comment:            DRUG SCREEN FOR MEDICAL PURPOSES     ONLY.  IF CONFIRMATION IS NEEDED      FOR ANY PURPOSE, NOTIFY LAB     WITHIN 5 DAYS.                LOWEST DETECTABLE LIMITS     FOR URINE DRUG SCREEN     Drug Class       Cutoff (ng/mL)     Amphetamine      1000     Barbiturate      200     Benzodiazepine   099     Tricyclics  300     Opiates          300     Cocaine          300     THC              50  COMPREHENSIVE METABOLIC PANEL     Status: Abnormal   Collection Time    10/20/13  6:48 AM      Result Value Ref Range   Sodium 141  137 - 147 mEq/L   Potassium 3.9  3.7 - 5.3 mEq/L   Chloride 104  96 - 112 mEq/L   CO2 28  19 - 32 mEq/L   Glucose, Bld 92  70 - 99 mg/dL   BUN 8  6 - 23 mg/dL   Creatinine, Ser 0.85  0.50 - 1.35 mg/dL   Calcium 8.8  8.4 - 10.5 mg/dL   Total Protein 6.9  6.0 - 8.3 g/dL   Albumin 3.1 (*) 3.5 - 5.2 g/dL   AST 15  0 - 37 U/L   ALT 10  0 - 53 U/L   Alkaline Phosphatase 105  39 - 117 U/L   Total Bilirubin 0.8  0.3 - 1.2 mg/dL   GFR calc non Af Amer 80 (*) >90 mL/min   GFR calc Af Amer >90  >90 mL/min   Comment: (NOTE)     The eGFR has been calculated using the CKD EPI equation.     This calculation has not been validated in all clinical situations.     eGFR's persistently <90 mL/min signify possible Chronic Kidney     Disease.  CBC     Status: Abnormal   Collection Time    10/20/13  6:48 AM      Result Value Ref Range   WBC 8.2  4.0 - 10.5 K/uL   RBC 4.21 (*) 4.22 - 5.81 MIL/uL   Hemoglobin 12.4 (*) 13.0 - 17.0 g/dL   HCT 37.0 (*) 39.0 - 52.0 %   MCV 87.9  78.0 - 100.0 fL   MCH 29.5  26.0 - 34.0 pg   MCHC 33.5  30.0 - 36.0 g/dL   RDW 13.3  11.5 - 15.5 %   Platelets 162  150 - 400 K/uL  PROTIME-INR     Status: None   Collection Time    10/20/13  6:48 AM      Result Value Ref Range   Prothrombin Time 14.1  11.6 - 15.2 seconds   INR 1.11  0.00 - 1.49  HOMOCYSTEINE     Status: None   Collection Time    10/20/13 11:05 AM      Result Value Ref Range   Homocysteine 11.5  4.0 - 15.4 umol/L   Comment: Performed at [facility]  RPR     Status: None   Collection Time    10/20/13 11:05 AM      Result Value Ref Range   RPR NON REACTIVE  NON REACTIVE   Comment: Performed at [facility]  VITAMIN B12     Status: None   Collection Time    10/20/13 11:05 AM      Result Value Ref Range   Vitamin B-12 398  211 - 911 pg/mL   Comment: Performed at [facility]  TSH     Status: None   Collection Time    10/20/13 11:05 AM      Result Value Ref Range   TSH  1.668  0.350 - 4.500 uIU/mL   Comment: Performed at [place] Panel No results found for this basename: CHOL, TRIG, HDL, CHOLHDL, VLDL, LDLCALC,  in the last 72 hours  Studies/Results: Dg Chest 2 View  10/20/2013   CLINICAL DATA:  Pneumonia.  EXAM: CHEST  2 VIEW  COMPARISON:  Chest x-ray 10/19/2013.  FINDINGS: Linear opacities in the periphery of the upper left hemithorax are again noted, favored to represent areas of subsegmental atelectasis, scarring or potential pleural plaques. Patchy areas of interstitial prominence are noted throughout the mid to lower lungs bilaterally. No frank consolidative airspace disease. No definite pleural effusions. No evidence of pulmonary edema. Heart size is normal. The patient is rotated to the upright on today's exam, resulting in distortion of the mediastinal contours and reduced diagnostic sensitivity and specificity for mediastinal pathology. Atherosclerosis in the thoracic aorta.  IMPRESSION: 1. Patchy areas of interstitial prominence throughout the mid to lower lungs bilaterally. Whether or not this is chronic or indicative of an acute process such is infection or sequela of mild aspiration is uncertain. 2. Linear opacity in the periphery of the low upper left hemithorax may reflect scarring, atelectasis or pleural plaque. 3. Atherosclerosis.   Electronically Signed   By: Vinnie Langton M.D.   On: 10/20/2013 08:21   Ct Head Wo Contrast  10/19/2013   CLINICAL DATA:  Altered level of  consciousness.  Hypertension.  EXAM: CT HEAD WITHOUT CONTRAST  TECHNIQUE: Contiguous axial images were obtained from the base of the skull through the vertex without intravenous contrast.  COMPARISON:  None.  FINDINGS: No intracranial hemorrhage.  Moderate small vessel disease type changes.  Remote small frontal lobe infarcts and right thalamic infarct. No CT evidence of large acute infarct. If small/medium size infarct is of high clinical concern, MR can be obtained for further delineation.  Atrophy. Ventricular prominence may be related to atrophy. Difficult to exclude a very mild component of hydrocephalus.  No intracranial mass lesion noted on this unenhanced exam.  Vascular calcifications.  IMPRESSION: No intracranial hemorrhage.  Moderate small vessel disease type changes.  Remote small frontal lobe infarcts and right thalamic infarct. No CT evidence of large acute infarct.  Atrophy. Ventricular prominence may be related to atrophy. Difficult to exclude a very mild component of hydrocephalus.  Please see above.   Electronically Signed   By: Chauncey Cruel M.D.   On: 10/19/2013 17:43   Mr Brain Wo Contrast  10/20/2013   CLINICAL DATA:  80 year old male with right lower extremity weakness and confusion. Hypertension and prior stroke. Initial encounter.  EXAM: MRI HEAD WITHOUT CONTRAST  TECHNIQUE: Multiplanar, multiecho pulse sequences of the brain and surrounding structures were obtained without intravenous contrast.  COMPARISON:  Head CT without contrast 10/19/2013.  FINDINGS: Study is intermittently degraded by motion artifact despite repeated imaging attempts.  Cerebral volume loss, appears to disproportionally affect the parietal lobes.  No restricted diffusion or evidence of acute infarction. Intracranial artery dolichoectasia. Dominant, dolichoectatic distal left vertebral artery. Major intracranial vascular flow voids are preserved.  Patchy and confluent bilateral cerebral white matter T2 and FLAIR  hyperintensity. Prominent ventricles felt to be ex vacuo in nature. No midline shift, mass effect, or evidence of intracranial mass lesion. Chronic hemorrhage near the right motor strip (series 8, image 19) with otherwise subtle encephalomalacia. Chronic deep gray matter lacunar infarcts, most notably involving the right thalamus.  Visualized orbit soft tissues are within normal limits. Minor paranasal sinus mucosal  thickening. Mastoids are clear. Visible internal auditory structures appear normal. Negative pituitary and cervicomedullary junction. Normal bone marrow signal. Negative visualized cervical spine. Visualized scalp soft tissues are within normal limits.  IMPRESSION: 1.  No acute intracranial abnormality. 2. Chronic ischemic disease (mostly small vessel changes) and cerebral volume loss.   Electronically Signed   By: Lars Pinks M.D.   On: 10/20/2013 09:27   US Carotid Duplex Bilateral  10/20/2013   CLINICAL DATA:  Cerebral infarcts, hypertension.  EXAM: BILATERAL CAROTID DUPLEX ULTRASOUND  TECHNIQUE: Pearline Cables scale imaging, color Doppler and duplex ultrasound were performed of bilateral carotid and vertebral arteries in the neck.  COMPARISON:  None.  FINDINGS: Criteria: Quantification of carotid stenosis is based on velocity parameters that correlate the residual internal carotid diameter with NASCET-based stenosis levels, using the diameter of the distal internal carotid lumen as the denominator for stenosis measurement.  The following velocity measurements were obtained:  RIGHT  ICA:  169/14 cm/sec  CCA:  81/27 cm/sec  SYSTOLIC ICA/CCA RATIO:  2.0  DIASTOLIC ICA/CCA RATIO:  1.4  ECA:  106 cm/sec  LEFT  ICA:  63/9 cm/sec  CCA:  51/7 cm/sec  SYSTOLIC ICA/CCA RATIO:  0.8  DIASTOLIC ICA/CCA RATIO:  1.2  ECA:  85 cm/sec  RIGHT CAROTID ARTERY: There is a moderate amount of partially calcified plaque at the level of the distal bulb and proximal ICA. Estimated proximal right ICA stenosis is 50- 69%.  RIGHT  VERTEBRAL ARTERY: Antegrade flow with normal waveform and velocity.  LEFT CAROTID ARTERY: Moderate amount of partially calcified plaque is present predominantly at the level of the distal common carotid artery and carotid bulb. Plaque does extend into the ICA. Estimated proximal left ICA stenosis is less than 50%.  LEFT VERTEBRAL ARTERY: Antegrade flow with normal waveform and velocity.  IMPRESSION: Moderate atherosclerotic plaque at both carotid bifurcations. Estimated right ICA stenosis is 50- 69% and left ICA stenosis is less than 50%.   Electronically Signed   By: Aletta Edouard M.D.   On: 10/20/2013 08:21   Dg Chest Portable 1 View  10/19/2013   CLINICAL DATA:  Elevated blood pressure.  EXAM: PORTABLE CHEST - 1 VIEW  COMPARISON:  None.  FINDINGS: Remote right rib trauma. Patient rotated right. Normal heart size and mediastinal contours for age. Right costophrenic angle blunting could relate to minimal fluid or pleural thickening. No pneumothorax. Lower lobe predominant interstitial thickening is nonspecific, especially in this age group. Slightly greater on the left. No lobar consolidation. An irregular opacity projecting over the left upper lung may represent a calcified pleural plaque.  IMPRESSION: Lower lobe predominant interstitial thickening is nonspecific in this age group. Slightly greater on the left. This could represent the sequelae of chronic bronchitis or smoking. Difficult to exclude early acute infectious process, given absence of prior radiographs. Consider further evaluation with PA and lateral radiograph and consideration of plain film followup.  Small right pleural effusion versus pleural thickening.  Suspicion of calcified pleural plaque within the upper left hemi thorax. Considerations include prior complex left pleural effusion or even asbestos related pleural disease. This could also be further characterized with PA and lateral radiographs.   Electronically Signed   By: Abigail Miyamoto  M.D.   On: 10/19/2013 15:13    Medications:  Scheduled Meds: . aspirin EC  325 mg Oral Daily  . azithromycin  500 mg Intravenous Q24H  . cefTRIAXone (ROCEPHIN)  IV  1 g Intravenous Q24H  . donepezil  5 mg  Oral QHS  . enoxaparin (LOVENOX) injection  40 mg Subcutaneous Q24H  . feeding supplement (ENSURE COMPLETE)  237 mL Oral BID BM  . folic acid  1 mg Oral Daily  . multivitamin with minerals  1 tablet Oral Daily  . sodium chloride  3 mL Intravenous Q12H  . thiamine  100 mg Oral Daily   Continuous Infusions:  PRN Meds:.acetaminophen, acetaminophen, albuterol, ondansetron (ZOFRAN) IV, ondansetron     LOS: 2 days   Jason Pratt A. Merlene Laughter, M.D.  Diplomate, Tax adviser of Psychiatry and Neurology ( Neurology).

## 2013-10-21 NOTE — Evaluation (Signed)
Occupational Therapy Evaluation Patient Details Name: Jason Pratt MRN: [id number] DOB: 1930/05/20 Today's Date: 10/21/2013 Time: [id number] OT Time Calculation (min): 19 min Eval 19'  OT Assessment / Plan / Recommendation History of present illness Pt with a hx of old strokes is admitted with acute encephalopathy.  He has had progressive decline in funcutional status over the past year according to his ex-wife (who is his care giver) and became unable to walk yesterday.   Clinical Impression   Pt is presenting to acute OT with above situation.  He was previously living with his ex-wife who feels she can no longer provide sufficient care for him.  Pt demonstrates no physical deficits in his ADL functioning, but would benefit from 24-hour supervision for safety and daily assist/cueing for ADL needs. Recommend placement for pt, but no OT follow-up.    OT Assessment  Patient does not need any further OT services    Follow Up Recommendations  No OT follow up                Precautions / Restrictions Precautions Precautions: Fall Restrictions Weight Bearing Restrictions: No   Pertinent Vitals/Pain Pt denies pain    ADL  Eating/Feeding: Supervision/safety Grooming: Minimal assistance;Shaving Lower Body Dressing: Min guard Where Assessed - Lower Body Dressing: Unsupported sitting ADL Comments: Pt is physcially able to complete ADLs but requires significant cueing and is currenlty unsafe completing them independenlty.      Visit Information  Last OT Received On: 10/21/13 History of Present Illness: Pt with a hx of old strokes is admitted with acute encephalopathy.  He has had progressive decline in funcutional status over the past year according to his ex-wife (who is his care giver) and became unable to walk yesterday.       Prior Functioning     Home Living Family/patient expects to be discharged to:: Skilled nursing facility Living Arrangements: Spouse/significant  other Prior Function Level of Independence: Needs assistance Gait / Transfers Assistance Needed: pt had been able to ambulate with a walker and transfer independently ADL's / Homemaking Assistance Needed: Pt required cueing and assist for ADLs     Vision/Perception Vision - History Baseline Vision: Wears glasses all the time   Cognition  Cognition Arousal/Alertness: Awake/alert Behavior During Therapy: WFL for tasks assessed/performed Overall Cognitive Status: History of cognitive impairments - at baseline    Extremity/Trunk Assessment Upper Extremity Assessment Upper Extremity Assessment: Overall WFL for tasks assessed (pt tested 5/5 MMT with multiple cues. Pt was able to open box of toothpasts and take oof/put on lid with no difficulty) Lower Extremity Assessment Lower Extremity Assessment: Defer to PT evaluation     Mobility Bed Mobility Overal bed mobility: Independent        Balance Balance Sitting-balance support: Feet supported;No upper extremity supported Sitting balance-Leahy Scale: Good   End of Session OT - End of Session Activity Tolerance: Patient tolerated treatment well Patient left: in bed;with call bell/phone within reach;with family/visitor present  GO     Marry GuanMarie Rawlings, MS, OTR/L [phone]  10/21/2013, 12:05 PM

## 2013-10-21 NOTE — Clinical Social Work Note (Signed)
CSW presented bed offers to pt and pt's ex-wife. They choose [facility]. Facility notified. Possible d/c tomorrow.  Derenda FennelKara Huldah Marin, [place] [phone]

## 2013-10-21 NOTE — Progress Notes (Signed)
   I agree with the History/assesment & plan per Midlevel provider as per above Further details as follows:-         80 year old ?, known chronic smoker admitted  10/19/13 c worsening gait, fall, speech impairment and ? Right-sided facial droop.  On admission found to have potential community acquired pneumonia as well.  Neurology was consulted and workup ensued regarding unsteady gait = negative workup so far as per mid-level note. Neurology has seen the patient and recommended to start Aricept--I estimate that he has at least mild dementia based on my interview today he is able to tell [place] [facility] [place] [facility] but he cannot tell me the date EEG performed 10/21/13 = normal Echocardiogram 10/20/13 = grade 1 diastolic dysfunction with hypokinesis Carotids = ICA stenosis right-sided = 50-69%, left side = less than 50%   Today is doing well-tolerating diet, speech therapy has seen him and made recommendations including dysphagia 3 diet-he has no other spontaneous complaints. He understands that he may need to go to a skilled nursing facility. He ambulated around the unit earlier today with therapy and apparently did well, but had difficulty following simple commands and needed multimodal queing.   P He should be stable for potential SNF discharge tomorrow 10/22/13  Antibiotics to be narrowed to oral equivalent [Levaquin] that would cover both aspiration [more likely etiology], and CA-stop date =10/25/13 Repeat CBC in am  Pleas KochJai Elius Etheredge, MD [facility] [phone]) [phone]

## 2013-10-21 NOTE — Procedures (Signed)
  [facility] Emmani Lesueur A. Gerilyn Pilgrimoonquah, MD     [email or web address]           HISTORY: This is an 80 year old man who presents with confusion and altered mental status. This does be done to evaluate for seizures.  MEDICATIONS: Scheduled Meds: . aspirin EC  325 mg Oral Daily  . azithromycin  500 mg Intravenous Q24H  . cefTRIAXone (ROCEPHIN)  IV  1 g Intravenous Q24H  . donepezil  5 mg Oral QHS  . enoxaparin (LOVENOX) injection  40 mg Subcutaneous Q24H  . feeding supplement (ENSURE COMPLETE)  237 mL Oral BID BM  . folic acid  1 mg Oral Daily  . multivitamin with minerals  1 tablet Oral Daily  . sodium chloride  3 mL Intravenous Q12H  . thiamine  100 mg Oral Daily   Continuous Infusions:  PRN Meds:.acetaminophen, acetaminophen, albuterol, ondansetron (ZOFRAN) IV, ondansetron  Prior to Admission medications   Not on File      ANALYSIS: A 16 channel recording using standard 10 20 measurements is conducted for 21 minutes. There is a well-formed posterior dominant rhythm of 9-9-1/2 Hz which attenuates with eye opening. There are beta activity is observed in the frontal areas. Awake and drowsy activities are observed. Photic stimulation and hyperventilation were not carried out. There are no focal or lateralized slowing. There are no epileptiform activities observed.   IMPRESSION: 1. This test is normal.      Kostantinos Tallman A. Gerilyn Pilgrimoonquah, M.D.  Diplomate, Biomedical engineerAmerican Board of Psychiatry and Neurology ( Neurology).

## 2013-10-21 NOTE — Progress Notes (Signed)
Physical Therapy Treatment Patient Details Name: Jason Pratt MRN: [id number] DOB: 1930/08/02 Today's Date: 10/21/2013 Time: [id number] PT Time Calculation (min): 24 min  PT Assessment / Plan / Recommendation  History of Present Illness Pt with a hx of old strokes is admitted with acute encephalopathy.  He has had progressive decline in funcutional status over the past year according to his ex-wife (who is his care giver) and became unable to walk yesterday.   PT Comments   Pt pleasant and cooperative. Pt require multimodal cueing to completes exercises correctly. Pt has difficulty following simple commands. Pt requires multimodal cueing for safety with transfer from sit to stand and stand to sit. Pt tolerates gait training for 20' and presents with flexed trunk and shuffling gait.        Plan Current plan remains appropriate    Precautions / Restrictions Precautions Precautions: Fall Restrictions Weight Bearing Restrictions: No   Pertinent Vitals/Pain No pain.    Mobility  Bed Mobility Overal bed mobility: Independent Transfers Overall transfer level: Needs assistance Equipment used: Rolling walker (2 wheeled) Transfers: Sit to/from Stand Sit to Stand: Min assist General transfer comment: Pt requires multimodal cueing for safety Ambulation/Gait Ambulation/Gait assistance: Min assist Ambulation Distance (Feet): 20 Feet Assistive device: Rolling walker (2 wheeled) Gait Pattern/deviations: Trunk flexed;Shuffle Gait velocity interpretation: Below normal speed for age/gender    Exercises General Exercises - Lower Extremity Ankle Circles/Pumps: Both;10 reps;Supine Quad Sets: Both;10 reps;Supine Heel Slides: Both;10 reps;Supine Hip ABduction/ADduction: Both;10 reps;Supine   Visit Information  Last PT Received On: 10/21/13 History of Present Illness: Pt with a hx of old strokes is admitted with acute encephalopathy.  He has had progressive decline in funcutional status over the  past year according to his ex-wife (who is his care giver) and became unable to walk yesterday.    Subjective Data  "I'm willing to try to walk if you want me to."  Cognition  Cognition Arousal/Alertness: Awake/alert Behavior During Therapy: [facility] for tasks assessed/performed Overall Cognitive Status: History of cognitive impairments - at baseline    Balance  Balance Sitting-balance support: Feet supported;No upper extremity supported Sitting balance-Leahy Scale: Good  End of Session PT - End of Session Equipment Utilized During Treatment: Gait belt Activity Tolerance: Patient tolerated treatment well Patient left: in bed;with call bell/phone within reach;with bed alarm set;with family/visitor present   Seth Bakeebekah Yissel Habermehl, PTA  10/21/2013, 2:58 PM

## 2013-10-22 ENCOUNTER — Inpatient Hospital Stay
Admission: RE | Admit: 2013-10-22 | Discharge: 2013-12-04 | Disposition: A | Discharge status: Other Acute Inpatient Hospital | Payer: Medicaid Other | Source: Ambulatory Visit | Attending: Internal Medicine | Admitting: Internal Medicine

## 2013-10-22 ENCOUNTER — Encounter (HOSPITAL_COMMUNITY): Payer: Self-pay | Admitting: Internal Medicine

## 2013-10-22 DIAGNOSIS — Z720 Tobacco use: Secondary | ICD-10-CM | POA: Diagnosis present

## 2013-10-22 DIAGNOSIS — I6521 Occlusion and stenosis of right carotid artery: Secondary | ICD-10-CM

## 2013-10-22 DIAGNOSIS — I6529 Occlusion and stenosis of unspecified carotid artery: Secondary | ICD-10-CM

## 2013-10-22 DIAGNOSIS — N400 Enlarged prostate without lower urinary tract symptoms: Secondary | ICD-10-CM

## 2013-10-22 DIAGNOSIS — G309 Alzheimer's disease, unspecified: Principal | ICD-10-CM

## 2013-10-22 DIAGNOSIS — F0280 Dementia in other diseases classified elsewhere without behavioral disturbance: Secondary | ICD-10-CM | POA: Diagnosis present

## 2013-10-22 DIAGNOSIS — R32 Unspecified urinary incontinence: Secondary | ICD-10-CM

## 2013-10-22 DIAGNOSIS — J929 Pleural plaque without asbestos: Principal | ICD-10-CM

## 2013-10-22 HISTORY — DX: Dementia in other diseases classified elsewhere, unspecified severity, without behavioral disturbance, psychotic disturbance, mood disturbance, and anxiety: F02.80

## 2013-10-22 HISTORY — DX: Occlusion and stenosis of right carotid artery: I65.21

## 2013-10-22 LAB — CBC WITH DIFFERENTIAL/PLATELET
Basophils Absolute: 0 10*3/uL (ref 0.0–0.1)
Basophils Relative: 0 % (ref 0–1)
EOS PCT: 1 % (ref 0–5)
Eosinophils Absolute: 0.1 10*3/uL (ref 0.0–0.7)
HCT: 39.1 % (ref 39.0–52.0)
Hemoglobin: 13.4 g/dL (ref 13.0–17.0)
LYMPHS ABS: 0.8 10*3/uL (ref 0.7–4.0)
LYMPHS PCT: 11 % — AB (ref 12–46)
MCH: 29.6 pg (ref 26.0–34.0)
MCHC: 34.3 g/dL (ref 30.0–36.0)
MCV: 86.5 fL (ref 78.0–100.0)
Monocytes Absolute: 0.5 10*3/uL (ref 0.1–1.0)
Monocytes Relative: 8 % (ref 3–12)
NEUTROS ABS: 5.7 10*3/uL (ref 1.7–7.7)
NEUTROS PCT: 80 % — AB (ref 43–77)
Platelets: 196 10*3/uL (ref 150–400)
RBC: 4.52 MIL/uL (ref 4.22–5.81)
RDW: 13.1 % (ref 11.5–15.5)
WBC: 7.1 10*3/uL (ref 4.0–10.5)

## 2013-10-22 MED ORDER — FOLIC ACID 1 MG PO TABS: 1.0000 mg | ORAL_TABLET | Freq: Every day | ORAL | Status: AC

## 2013-10-22 MED ORDER — LEVOFLOXACIN 750 MG PO TABS: 750.0000 mg | ORAL_TABLET | Freq: Every day | ORAL | Status: AC

## 2013-10-22 MED ORDER — ENSURE COMPLETE PO LIQD: 237.0000 mL | Freq: Two times a day (BID) | ORAL | Status: DC

## 2013-10-22 MED ORDER — THIAMINE HCL 100 MG PO TABS: 100.0000 mg | ORAL_TABLET | Freq: Every day | ORAL | Status: AC

## 2013-10-22 MED ORDER — ASPIRIN 325 MG PO TBEC: 325.0000 mg | DELAYED_RELEASE_TABLET | Freq: Every day | ORAL | Status: DC

## 2013-10-22 MED ORDER — CLONIDINE HCL 0.1 MG PO TABS: 0.1000 mg | ORAL_TABLET | Freq: Four times a day (QID) | ORAL | Status: DC | PRN

## 2013-10-22 MED ORDER — ADULT MULTIVITAMIN W/MINERALS CH: 1.0000 | ORAL_TABLET | Freq: Every day | ORAL | Status: AC

## 2013-10-22 MED ORDER — DONEPEZIL HCL 5 MG PO TABS: 5.0000 mg | ORAL_TABLET | Freq: Every day | ORAL | Status: DC

## 2013-10-22 NOTE — Progress Notes (Signed)
[facility] discharge [facility]. Discharge packet sent with patient. [facility]. Patient stable. Patient transported by staff.

## 2013-10-22 NOTE — Clinical Social Work Placement (Signed)
Clinical Social Work Department CLINICAL SOCIAL WORK PLACEMENT NOTE 10/22/2013  Patient:  Jason Pratt,Jason Pratt  Account Number:  [id number] Admit date:  10/19/2013  Clinical Social Worker:  Samuella BruinKRISTIN DRINKARD, Theresia MajorsLCSWA  Date/time:  10/20/2013 04:17 PM  Clinical Social Work is seeking post-discharge placement for this patient at the following level of care:   SKILLED NURSING   (*CSW will update this form in Epic as items are completed)   10/20/2013  [facility]/family provided with [name] of Clinical Social Work's list of facilities offering this level of care within the geographic area requested by the patient (or if unable, by the patient's family).  10/20/2013  Patient/family informed of their freedom to choose among providers that offer the needed level of care, that participate in Medicare, Medicaid or managed care program needed by the patient, have an available bed and are willing to accept the patient.  10/20/2013  Patient/family informed of [facility]' ownership interest in [facility], as well as of the fact that they are under no obligation to receive care at this facility.  PASARR submitted to EDS on 10/20/2013 PASARR number received from EDS on 10/20/2013  FL2 transmitted to all facilities in geographic area requested by pt/family on  10/20/2013 FL2 transmitted to all facilities within larger geographic area on   Patient informed that his/her managed care company has contracts with or will negotiate with  certain facilities, including the following:     Patient/family informed of bed offers received:  10/21/2013 [facility] Physician [facility] and [facility]  [facility] on  10/22/2013 Patient to be transferred to facility by RN  The following physician request were entered in Epic:   Additional Comments:  Derenda FennelKara Itsel Opfer, LCSW [phone]

## 2013-10-22 NOTE — Discharge Summary (Signed)
Physician Discharge Summary  Jason Pratt NUU:[id number] DOB: [birth year] DOA: 10/19/2013  PCP: No PCP Per Patient  Admit date: 10/19/2013 Discharge date: 10/22/2013  Time spent: 40 minutes  Recommendations for Outpatient Follow-up:  1. Provider at [facility] in 1 week for evaluation of BP. Recommending monitoring BP BID for 7 days for evaluation.  2. Physical therapy for strength and endurance 3. Follow up with Dr. Gerilyn Pilgrim in 2 months.  Discharge Diagnoses:  Principal Problem:   Acute encephalopathy Active Problems: Alzheimers dementia   Weakness/right lower extremity     CAP (community acquired pneumonia)   Urinary incontinence   Abnormal CXR   Malnutrition of moderate degree Tobacco abuse   Discharge Condition: stable  Diet recommendation: dysphagia 3 thin liquids  Filed Weights   10/19/13 1440 10/19/13 1900  Weight: 70.308 kg (155 lb) 64.2 kg (141 lb 8.6 oz)    History of present illness:  Jason Pratt is a 80 y.o. male who does not have any significant past medical history primarily because he hasn't seen a physician in many years. Patient was accompanied by his ex-wife and son. Ex-wife was the main historian. Patient was unable to provide much information. Reportedly for previous 2 weeks at least, ex-wife has noticed that the patient  had worsening gait. He almost fell a few times. No actual falls.  He had also developed incontinence of urine at times. He was also noted to have speech impairment. And, at times she also seen right facial droop. No seizure activity. No fever. No chills. She'd also noticed that especially for the previous few months his memory had been getting worse. There is no history of any back pain. Patient denied any back pain. No stool incontinence. Patient's ex-wife denied any choking episode with eating or drinking. On day of admission patient was noted have weakness in the right leg. He couldn't bear weight on that leg. She also noticed that he was not able  to move his right foot at that time. So EMS was called and the patient was brought into the hospital. Patient has a remote history of alcoholism, but hasn't had anything to drink since the 90s. Continues to smoke cigarettes about half a pack on a daily basis. Has required a walker to ambulate over the past one year, but the ex-wife reported a decline over this time period as well   Hospital Course:  #1 right lower extremity weakness, and unsteady gait: concern for stroke, worsening dementia and/or nutritional deficits. Likely related to worsening dementia per clinical history. MRI brain without acute abnormality but evidence of previous episodes of ischemic insult.  Carotid Dopplers with moderate atherosclerotic plaque at both carotid bifurcations. Estimated right ICA stenosis is 50- 69% and left ICA stenosis is less than 50. EEG unremarkable. Evaluated by neurology who opines worsening Alzheimer type dementia and initiated Aricept. Continue aspirin. B12, RPR, TSH, Homocysteine levels within the limits of normal. Evaluated by Physical therapy who recommending snf. He is being discharged to [facility] for rehab.   #2 acute encephalopathy: Clinical hx indicates going on for a long period of time. Neuro opines likely related to dementia of Alzheimer's type. TSH, RPR, B12 within the limits of normal. EEG within the limits of normal. No evidence of stroke. Evaluated by Dr. Gerilyn Pilgrim who opined likely worsening Alzheimer type dementia. Aricept initiated. Recommending follow up appointment in 2 months   #3 questionable community acquired pneumonia: Received ceftriaxone and azithromycin for 3 days. Strep and Legionella antigens negative. Transitioned to Levaquin  10/21/13.  He remained afebrile and non-toxic appearing. Oxygen saturation level >94% on room air. Will discharge with levaquin to complete 5 day course.   #4 Abnormal chest x-ray: The possibility was raised about a pleural plaque in the left upper lobe. 2  view chest x-ray yields linear opacity in the periphery of the low upper left hemithorax may reflect scarring, atelectasis or pleural plaque. He is a smoker and so is at risk for malignancy. Recommend repeat chest xray in 4-6 weeks.  #5. HTN: hx of same but not on mediations as he has not seen physician in many years. Mostly controlled during this hospitalization. Provided clonidine prn. Recommend monitoring of BP closely while resident [facility] for further evaluation.  #6. Malnutrition of moderate degree. Evaluated by nutritional management who recommend Ensure complete po BID.    Procedures: EEG performed 10/21/13 = normal  Echocardiogram 10/20/13 = grade 1 diastolic dysfunction with hypokinesis Carotids = ICA stenosis right-sided = 50-69%, left side = less than 50%   Consultations:  neurology  Discharge Exam: Filed Vitals:   10/22/13 0645  BP: 167/89  Pulse: 86  Temp: 97.9 F (36.6 C)  Resp: 16    General: well nourished NAD Cardiovascular: S1 and S2 no MGR No LE edema Respiratory: normal effort BS clear bilaterally but somewhat coarse Neuro: alert oriented to self only. Attempts to follow commands.   Discharge Instructions     Medication List         aspirin 325 MG EC tablet  Take 1 tablet (325 mg total) by mouth daily.     cloNIDine 0.1 MG tablet  Commonly known as:  CATAPRES  Take 1 tablet (0.1 mg total) by mouth 4 (four) times daily as needed (For greater than 160/100).     donepezil 5 MG tablet  Commonly known as:  ARICEPT  Take 1 tablet (5 mg total) by mouth at bedtime.     feeding supplement (ENSURE COMPLETE) Liqd  Take 237 mLs by mouth 2 (two) times daily between meals.     folic acid 1 MG tablet  Commonly known as:  FOLVITE  Take 1 tablet (1 mg total) by mouth daily.     levofloxacin 750 MG tablet  Commonly known as:  LEVAQUIN  Take 1 tablet (750 mg total) by mouth daily.     multivitamin with minerals Tabs tablet  Take 1 tablet by mouth daily.      thiamine 100 MG tablet  Take 1 tablet (100 mg total) by mouth daily.       No Known Allergies Follow-up Information   Follow up with Provider at [facility] (recommend monitoring BP BID for 7 days. submit to [facility] center provider for evaluation and BP control)       Follow up with No PCP Per Patient.   Specialty:  General Practice      Follow up with [facility], Darleen Crocker, MD. Schedule an appointment as soon as possible for a visit in 2 months. (for evaluation of symptoms)    Specialty:  Neurology   Contact information:   [address] [place] [address] [phone]        The results of significant diagnostics from this hospitalization (including imaging, microbiology, ancillary and laboratory) are listed below for reference.    Significant Diagnostic Studies: Dg Chest 2 View  10/20/2013   CLINICAL DATA:  Pneumonia.  EXAM: CHEST  2 VIEW  COMPARISON:  Chest x-ray 10/19/2013.  FINDINGS: Linear opacities in the periphery of  the upper left hemithorax are again noted, favored to represent areas of subsegmental atelectasis, scarring or potential pleural plaques. Patchy areas of interstitial prominence are noted throughout the mid to lower lungs bilaterally. No frank consolidative airspace disease. No definite pleural effusions. No evidence of pulmonary edema. Heart size is normal. The patient is rotated to the upright on today's exam, resulting in distortion of the mediastinal contours and reduced diagnostic sensitivity and specificity for mediastinal pathology. Atherosclerosis in the thoracic aorta.  IMPRESSION: 1. Patchy areas of interstitial prominence throughout the mid to lower lungs bilaterally. Whether or not this is chronic or indicative of an acute process such is infection or sequela of mild aspiration is uncertain. 2. Linear opacity in the periphery of the low upper left hemithorax may reflect scarring, atelectasis or pleural plaque. 3. Atherosclerosis.   Electronically  Signed   By: Trudie Reed M.D.   On: 10/20/2013 08:21   Ct Head Wo Contrast  10/19/2013   CLINICAL DATA:  Altered level of consciousness.  Hypertension.  EXAM: CT HEAD WITHOUT CONTRAST  TECHNIQUE: Contiguous axial images were obtained from the base of the skull through the vertex without intravenous contrast.  COMPARISON:  None.  FINDINGS: No intracranial hemorrhage.  Moderate small vessel disease type changes.  Remote small frontal lobe infarcts and right thalamic infarct. No CT evidence of large acute infarct. If small/medium size infarct is of high clinical concern, MR can be obtained for further delineation.  Atrophy. Ventricular prominence may be related to atrophy. Difficult to exclude a very mild component of hydrocephalus.  No intracranial mass lesion noted on this unenhanced exam.  Vascular calcifications.  IMPRESSION: No intracranial hemorrhage.  Moderate small vessel disease type changes.  Remote small frontal lobe infarcts and right thalamic infarct. No CT evidence of large acute infarct.  Atrophy. Ventricular prominence may be related to atrophy. Difficult to exclude a very mild component of hydrocephalus.  Please see above.   Electronically Signed   By: Bridgett Larsson M.D.   On: 10/19/2013 17:43   Mr Brain Wo Contrast  10/21/2013   CLINICAL DATA:  Altered mental status. Probable Alzheimer's disease with recent degeneration. Acute encephalopathy?  EXAM: MRI HEAD WITHOUT CONTRAST  TECHNIQUE: Multiplanar, multiecho pulse sequences of the brain and surrounding structures were obtained without intravenous contrast.  COMPARISON:  MR HEAD W/O CM dated 10/20/2013  FINDINGS: Today's exam was performed 36 hr after the previous MR.  No evidence for acute infarction, hemorrhage, mass lesion, hydrocephalus, or extra-axial fluid.  Advanced cerebral and cerebellar atrophy. Advanced chronic microvascular ischemic change in the periventricular and subcortical white matter. Remote large vessel infarct affects the  left parietal region. Chronic deep gray matter lacunar infarcts, strictly left thalamus. Chronic hemorrhage right posterior frontal cortex. No acute sinus or mastoid disease. No significant change from yesterday.  IMPRESSION: Advanced atrophy with small vessel disease and evidence for previous multiple episodes of ischemic insult.  No acute intracranial findings.   Electronically Signed   By: Davonna Belling M.D.   On: 10/21/2013 18:14   Mr Brain Wo Contrast  10/20/2013   CLINICAL DATA:  80 year old male with right lower extremity weakness and confusion. Hypertension and prior stroke. Initial encounter.  EXAM: MRI HEAD WITHOUT CONTRAST  TECHNIQUE: Multiplanar, multiecho pulse sequences of the brain and surrounding structures were obtained without intravenous contrast.  COMPARISON:  Head CT without contrast 10/19/2013.  FINDINGS: Study is intermittently degraded by motion artifact despite repeated imaging attempts.  Cerebral volume loss, appears to disproportionally  affect the parietal lobes.  No restricted diffusion or evidence of acute infarction. Intracranial artery dolichoectasia. Dominant, dolichoectatic distal left vertebral artery. Major intracranial vascular flow voids are preserved.  Patchy and confluent bilateral cerebral white matter T2 and FLAIR hyperintensity. Prominent ventricles felt to be ex vacuo in nature. No midline shift, mass effect, or evidence of intracranial mass lesion. Chronic hemorrhage near the right motor strip (series 8, image 19) with otherwise subtle encephalomalacia. Chronic deep gray matter lacunar infarcts, most notably involving the right thalamus.  Visualized orbit soft tissues are within normal limits. Minor paranasal sinus mucosal thickening. Mastoids are clear. Visible internal auditory structures appear normal. Negative pituitary and cervicomedullary junction. Normal bone marrow signal. Negative visualized cervical spine. Visualized scalp soft tissues are within normal limits.   IMPRESSION: 1.  No acute intracranial abnormality. 2. Chronic ischemic disease (mostly small vessel changes) and cerebral volume loss.   Electronically Signed   By: Augusto Gamble M.D.   On: 10/20/2013 09:27   US Carotid Duplex Bilateral  10/20/2013   CLINICAL DATA:  Cerebral infarcts, hypertension.  EXAM: BILATERAL CAROTID DUPLEX ULTRASOUND  TECHNIQUE: Wallace Cullens scale imaging, color Doppler and duplex ultrasound were performed of bilateral carotid and vertebral arteries in the neck.  COMPARISON:  None.  FINDINGS: Criteria: Quantification of carotid stenosis is based on velocity parameters that correlate the residual internal carotid diameter with NASCET-based stenosis levels, using the diameter of the distal internal carotid lumen as the denominator for stenosis measurement.  The following velocity measurements were obtained:  RIGHT  ICA:  169/14 cm/sec  CCA:  83/10 cm/sec  SYSTOLIC ICA/CCA RATIO:  2.0  DIASTOLIC ICA/CCA RATIO:  1.4  ECA:  106 cm/sec  LEFT  ICA:  63/9 cm/sec  CCA:  76/7 cm/sec  SYSTOLIC ICA/CCA RATIO:  0.8  DIASTOLIC ICA/CCA RATIO:  1.2  ECA:  85 cm/sec  RIGHT CAROTID ARTERY: There is a moderate amount of partially calcified plaque at the level of the distal bulb and proximal ICA. Estimated proximal right ICA stenosis is 50- 69%.  RIGHT VERTEBRAL ARTERY: Antegrade flow with normal waveform and velocity.  LEFT CAROTID ARTERY: Moderate amount of partially calcified plaque is present predominantly at the level of the distal common carotid artery and carotid bulb. Plaque does extend into the ICA. Estimated proximal left ICA stenosis is less than 50%.  LEFT VERTEBRAL ARTERY: Antegrade flow with normal waveform and velocity.  IMPRESSION: Moderate atherosclerotic plaque at both carotid bifurcations. Estimated right ICA stenosis is 50- 69% and left ICA stenosis is less than 50%.   Electronically Signed   By: Irish Lack M.D.   On: 10/20/2013 08:21   Dg Chest Portable 1 View  10/19/2013   CLINICAL DATA:   Elevated blood pressure.  EXAM: PORTABLE CHEST - 1 VIEW  COMPARISON:  None.  FINDINGS: Remote right rib trauma. Patient rotated right. Normal heart size and mediastinal contours for age. Right costophrenic angle blunting could relate to minimal fluid or pleural thickening. No pneumothorax. Lower lobe predominant interstitial thickening is nonspecific, especially in this age group. Slightly greater on the left. No lobar consolidation. An irregular opacity projecting over the left upper lung may represent a calcified pleural plaque.  IMPRESSION: Lower lobe predominant interstitial thickening is nonspecific in this age group. Slightly greater on the left. This could represent the sequelae of chronic bronchitis or smoking. Difficult to exclude early acute infectious process, given absence of prior radiographs. Consider further evaluation with PA and lateral radiograph and consideration of plain film  followup.  Small right pleural effusion versus pleural thickening.  Suspicion of calcified pleural plaque within the upper left hemi thorax. Considerations include prior complex left pleural effusion or even asbestos related pleural disease. This could also be further characterized with PA and lateral radiographs.   Electronically Signed   By: Jeronimo GreavesKyle  Talbot M.D.   On: 10/19/2013 15:13    Microbiology: No results found for this or any previous visit (from the past 240 hour(s)).   Labs: Basic Metabolic Panel:  Recent Labs Lab 10/19/13 1518 10/20/13 0648  NA 137 141  K 3.6* 3.9  CL 98 104  CO2 29 28  GLUCOSE 103* 92  BUN 11 8  CREATININE 1.01 0.85  CALCIUM 8.9 8.8  MG 1.9  --    Liver Function Tests:  Recent Labs Lab 10/20/13 0648  AST 15  ALT 10  ALKPHOS 105  BILITOT 0.8  PROT 6.9  ALBUMIN 3.1*   No results found for this basename: LIPASE, AMYLASE,  in the last 168 hours No results found for this basename: AMMONIA,  in the last 168 hours CBC:  Recent Labs Lab 10/19/13 1518 10/20/13 0648  10/22/13 0631  WBC 11.2* 8.2 7.1  NEUTROABS 9.7*  --  5.7  HGB 13.7 12.4* 13.4  HCT 40.7 37.0* 39.1  MCV 88.3 87.9 86.5  PLT 188 162 196   Cardiac Enzymes: No results found for this basename: CKTOTAL, CKMB, CKMBINDEX, TROPONINI,  in the last 168 hours BNP: BNP (last 3 results) No results found for this basename: PROBNP,  in the last 8760 hours CBG: No results found for this basename: GLUCAP,  in the last 168 hours     Signed:  Gwenyth BenderBLACK,Maxxwell Edgett M  [facility] 10/22/2013, 11:53 AM

## 2013-10-22 NOTE — Discharge Summary (Signed)
The patient was seen and examined. He was discussed with nurse practitioner, Ms. Vedia CofferBlack. Agree with her findings and assessment with additions below.. This is an 80 year old man who presented with acute confusion, likely secondary to worsening Alzheimer's dementia with superimposed community acquired pneumonia, which may have contributed to his encephalopathy. His ex-wife, who he lives with, endorses the patient's progressive confusion, though intermittent. He is clinically and symptomatically improved. He was encouraged to stop smoking indefinitely. Because of his deconditioning, he is being discharged to the [facility] for [facility]. It is unclear if the patient has capacity for informed consent, but he has agreed to allow his ex-wife to sign the official papers for him. Currently, he is alert and oriented to himself, his ex-wife, and the hospital.  Discharge diagnoses: 1. Acute encephalopathy secondary to progressive Alzheimer's dementia with superimposed community-acquired pneumonia. 2. Right lower extremity weakness and unsteady gait, likely secondary to worsening dementia. 3. Alzheimer's dementia. 4. Community acquired pneumonia. 5. Right carotid artery stenosis. 6. Tobacco abuse. The patient was advised to stop smoking. 7. Malnutrition of moderate degree. 8. Abnormal chest x-ray reflecting possible scarring, atelectasis, or pleural plaque. Recommend followup chest x-ray in 4-6 weeks. 9. Hypertension. 10. Deconditioning.

## 2013-10-22 NOTE — Clinical Social Work Note (Signed)
[facility], ex-wife, and facility aware and agreeable. Pt is [facility] aware. Pt to transfer with RN. D/C summary faxed.  Derenda FennelKara Alandis Bluemel, [place] [phone]

## 2013-10-22 NOTE — Progress Notes (Signed)
Physical Therapy Treatment Patient Details Name: Jason RevereOlen Pratt MRN: [id number] DOB: 11/20/1929 Today's Date: 10/22/2013 Time: [id number] PT Time Calculation (min): 32 min  PT Assessment / Plan / Recommendation     PT Comments   Spoke with RN before completing tx as patient has had altered vision since previous session. RN states that pt is cleared for PT. Pt is pleasant and cooperative. Pt displays improved tolerance for gait training this session. Pt displays increased tone in BLE with gait and bed exercises. Pt requires multimodal cueing to come from sit to supine. Pt continues to struggle to follow simple commands.  Progress towards PT Goals Progress towards PT goals: Progressing toward goals  Plan Current plan remains appropriate    Precautions / Restrictions Restrictions Weight Bearing Restrictions: No   Pertinent Vitals/Pain No pain.    Mobility  Bed Mobility Overal bed mobility: Needs Assistance Bed Mobility: Supine to Sit;Sit to Supine Supine to sit: Modified independent (Device/Increase time) Sit to supine: Mod assist General bed mobility comments: Pt requires manual assistance to return to supine secondary to difficulty following commands. Transfers Overall transfer level: Needs assistance Equipment used: Rolling walker (2 wheeled) Transfers: Sit to/from Stand Sit to Stand: Min assist General transfer comment: Pt requires multimodal cueing for safety. Ambulation/Gait Ambulation Distance (Feet): 30 Feet Assistive device: Rolling walker (2 wheeled) Gait Pattern/deviations: Trunk flexed;Shuffle Gait velocity interpretation: Below normal speed for age/gender    Exercises General Exercises - Lower Extremity Ankle Circles/Pumps: Both;10 reps;Supine Quad Sets: Both;10 reps;Supine Heel Slides: Both;10 reps;Supine Hip ABduction/ADduction: Both;10 reps;Supine     Visit Information  Last PT Received On: 10/22/13    Subjective Data  "I feel a little unsteady when I walk."   End of Session PT - End of Session Equipment Utilized During Treatment: Gait belt Activity Tolerance: Patient tolerated treatment well Patient left: in bed;with call bell/phone within reach;with bed alarm set;with family/visitor present Nurse Communication: Mobility status   Seth BakeRebekah Dorothy Polhemus, PTA  10/22/2013, 11:50 AM

## 2013-10-22 NOTE — Progress Notes (Signed)
Patient ID: Jason RevereOlen Pratt, male   DOB: [birth year], 80 y.o.   MRN: [id number]   [facility] Kinsley Nicklaus A. Gerilyn Pilgrimoonquah, MD     [email or web address]          Jason RevereOlen Pratt is an 80 y.o. male.   Assessment/Plan: 1. Progressive dementia likely of the Alzheimer's type. Dementia labs will be obtained. The patient will be started on Aricept.  2. Multifactorial gait impairment. The patient's brain MRI will be reviewed when available.   It appears the patient had some visual obscuration and dizziness which resulted in the patient having a repeat MRI scan. The results of the scan are unremarkable for anything acute. The ex-wife company that the patient has had these spells in the past. He apparently has retinal disease and was told he needs to see a special ophthalmologist do with the retinal problems but he refused. I suspect this most likely cause of the patient's visual distortions. The distortions are enlarged numbers or things being upside down.   GENERAL: The patient is in no acute distress.  HEENT: Supple. Atraumatic normocephalic.  ABDOMEN: soft  EXTREMITIES: No edema  BACK: Normal.  SKIN: Normal by inspection.  MENTAL STATUS: The patient is awake and alert.  He has more spontaneous speech and the nose that he is in the hospital. He is more appropriate with the conversations. CRANIAL NERVES: Pupils are equal, round and reactive to light and accommodation; extra ocular movements are full, there is no significant nystagmus; visual fields are full; upper and lower facial muscles are normal in strength and symmetric, there is no flattening of the nasolabial folds; tongue is midline; uvula is midline; shoulder elevation is normal.  MOTOR: Normal tone, bulk and strength; no pronator drift.  COORDINATION: Left finger to nose is normal, right finger to nose is normal, No rest tremor; no intention tremor; no postural tremor; no bradykinesia.     Objective: Vital signs in last 24 hours: Temp:  [97.9 F  (36.6 C)-98.4 F (36.9 C)] 97.9 F (36.6 C) (03/04 0645) Pulse Rate:  [86-101] 86 (03/04 0645) Resp:  [16-18] 16 (03/04 0645) BP: (133-185)/(70-89) 167/89 mmHg (03/04 0645) SpO2:  [94 %-95 %] 94 % (03/04 0645)  Intake/Output from previous day: 03/03 0701 - 03/04 0700 In: -  Out: 600 [Urine:600] Intake/Output this shift:   Nutritional status: Dysphagia   Lab Results: Results for orders placed during the hospital encounter of 10/19/13 (from the past 48 hour(s))  HOMOCYSTEINE     Status: None   Collection Time    10/20/13 11:05 AM      Result Value Ref Range   Homocysteine 11.5  4.0 - 15.4 umol/L   Comment: Performed at [facility]  RPR     Status: None   Collection Time    10/20/13 11:05 AM      Result Value Ref Range   RPR NON REACTIVE  NON REACTIVE   Comment: Performed at [facility]  VITAMIN B12     Status: None   Collection Time    10/20/13 11:05 AM      Result Value Ref Range   Vitamin B-12 398  211 - 911 pg/mL   Comment: Performed at [facility]  TSH     Status: None   Collection Time    10/20/13 11:05 AM      Result Value Ref Range   TSH 1.668  0.350 - 4.500 uIU/mL   Comment: Performed at [facility]  CBC WITH DIFFERENTIAL  Status: Abnormal   Collection Time    10/22/13  6:31 AM      Result Value Ref Range   WBC 7.1  4.0 - 10.5 K/uL   RBC 4.52  4.22 - 5.81 MIL/uL   Hemoglobin 13.4  13.0 - 17.0 g/dL   HCT 16.1  09.6 - 04.5 %   MCV 86.5  78.0 - 100.0 fL   MCH 29.6  26.0 - 34.0 pg   MCHC 34.3  30.0 - 36.0 g/dL   RDW 40.9  81.1 - 91.4 %   Platelets 196  150 - 400 K/uL   Neutrophils Relative % 80 (*) 43 - 77 %   Neutro Abs 5.7  1.7 - 7.7 K/uL   Lymphocytes Relative 11 (*) 12 - 46 %   Lymphs Abs 0.8  0.7 - 4.0 K/uL   Monocytes Relative 8  3 - 12 %   Monocytes Absolute 0.5  0.1 - 1.0 K/uL   Eosinophils Relative 1  0 - 5 %   Eosinophils Absolute 0.1  0.0 - 0.7 K/uL   Basophils Relative 0  0 - 1 %   Basophils  Absolute 0.0  0.0 - 0.1 K/uL    Lipid Panel No results found for this basename: CHOL, TRIG, HDL, CHOLHDL, VLDL, LDLCALC,  in the last 72 hours  Studies/Results: Mr Brain Wo Contrast  10/21/2013   CLINICAL DATA:  Altered mental status. Probable Alzheimer's disease with recent degeneration. Acute encephalopathy?  EXAM: MRI HEAD WITHOUT CONTRAST  TECHNIQUE: Multiplanar, multiecho pulse sequences of the brain and surrounding structures were obtained without intravenous contrast.  COMPARISON:  MR HEAD W/O CM dated 10/20/2013  FINDINGS: Today's exam was performed 36 hr after the previous MR.  No evidence for acute infarction, hemorrhage, mass lesion, hydrocephalus, or extra-axial fluid.  Advanced cerebral and cerebellar atrophy. Advanced chronic microvascular ischemic change in the periventricular and subcortical white matter. Remote large vessel infarct affects the left parietal region. Chronic deep gray matter lacunar infarcts, strictly left thalamus. Chronic hemorrhage right posterior frontal cortex. No acute sinus or mastoid disease. No significant change from yesterday.  IMPRESSION: Advanced atrophy with small vessel disease and evidence for previous multiple episodes of ischemic insult.  No acute intracranial findings.   Electronically Signed   By: Davonna Belling M.D.   On: 10/21/2013 18:14   Mr Brain Wo Contrast  10/20/2013   CLINICAL DATA:  80 year old male with right lower extremity weakness and confusion. Hypertension and prior stroke. Initial encounter.  EXAM: MRI HEAD WITHOUT CONTRAST  TECHNIQUE: Multiplanar, multiecho pulse sequences of the brain and surrounding structures were obtained without intravenous contrast.  COMPARISON:  Head CT without contrast 10/19/2013.  FINDINGS: Study is intermittently degraded by motion artifact despite repeated imaging attempts.  Cerebral volume loss, appears to disproportionally affect the parietal lobes.  No restricted diffusion or evidence of acute infarction.  Intracranial artery dolichoectasia. Dominant, dolichoectatic distal left vertebral artery. Major intracranial vascular flow voids are preserved.  Patchy and confluent bilateral cerebral white matter T2 and FLAIR hyperintensity. Prominent ventricles felt to be ex vacuo in nature. No midline shift, mass effect, or evidence of intracranial mass lesion. Chronic hemorrhage near the right motor strip (series 8, image 19) with otherwise subtle encephalomalacia. Chronic deep gray matter lacunar infarcts, most notably involving the right thalamus.  Visualized orbit soft tissues are within normal limits. Minor paranasal sinus mucosal thickening. Mastoids are clear. Visible internal auditory structures appear normal. Negative pituitary and cervicomedullary junction. Normal bone marrow signal.  Negative visualized cervical spine. Visualized scalp soft tissues are within normal limits.  IMPRESSION: 1.  No acute intracranial abnormality. 2. Chronic ischemic disease (mostly small vessel changes) and cerebral volume loss.   Electronically Signed   By: Augusto Gamble M.D.   On: 10/20/2013 09:27    Medications:  Scheduled Meds: . aspirin EC  325 mg Oral Daily  . donepezil  5 mg Oral QHS  . enoxaparin (LOVENOX) injection  40 mg Subcutaneous Q24H  . feeding supplement (ENSURE COMPLETE)  237 mL Oral BID BM  . folic acid  1 mg Oral Daily  . levofloxacin  750 mg Oral Daily  . multivitamin with minerals  1 tablet Oral Daily  . sodium chloride  3 mL Intravenous Q12H  . thiamine  100 mg Oral Daily   Continuous Infusions:  PRN Meds:.acetaminophen, acetaminophen, albuterol, cloNIDine, ondansetron (ZOFRAN) IV, ondansetron     LOS: 3 days   Saudia Smyser A. Gerilyn Pilgrim, M.D.  Diplomate, Biomedical engineer of Psychiatry and Neurology ( Neurology).

## 2013-10-23 ENCOUNTER — Non-Acute Institutional Stay (SKILLED_NURSING_FACILITY): Payer: Medicare Other | Admitting: Internal Medicine

## 2013-10-23 DIAGNOSIS — R9389 Abnormal findings on diagnostic imaging of other specified body structures: Secondary | ICD-10-CM

## 2013-10-23 DIAGNOSIS — R918 Other nonspecific abnormal finding of lung field: Secondary | ICD-10-CM

## 2013-10-23 DIAGNOSIS — R5381 Other malaise: Secondary | ICD-10-CM

## 2013-10-23 DIAGNOSIS — J189 Pneumonia, unspecified organism: Secondary | ICD-10-CM

## 2013-10-23 DIAGNOSIS — R531 Weakness: Secondary | ICD-10-CM

## 2013-10-23 DIAGNOSIS — F0280 Dementia in other diseases classified elsewhere without behavioral disturbance: Secondary | ICD-10-CM

## 2013-10-23 DIAGNOSIS — G9340 Encephalopathy, unspecified: Secondary | ICD-10-CM

## 2013-10-23 DIAGNOSIS — R5383 Other fatigue: Secondary | ICD-10-CM

## 2013-10-23 DIAGNOSIS — G309 Alzheimer's disease, unspecified: Principal | ICD-10-CM

## 2013-10-23 NOTE — Progress Notes (Signed)
Patient ID: Jason Pratt, male   DOB: [birth year], 80 y.o.   MRN: [id number]   This is an acute visit.  Local care skilled.  Facility [facility].  Chief complaint-acute visit status post hospitalization for acute encephalopathy thought secondary to dementia as well as pneumonia.  History of present illness.  Patient is a pleasant 80 year old male with a fairly benign past medical history primarily because he hasn't seen a physician in years.  Apparently patient presented to the ER with his ex-wife who he lives with and is his roommate.  Apparently patient had worsening gait over a period of 2 weeks as well as incontinence of urine.  He also has speech impairment and was thought to have a right facial droop by his ex-wife.  On his day of hospital admission he was noted to have right leg weakness he could not bear weight on that leg.  Patient apparently continued to smoke cigarettes about half a pack a day and had been requiring a walker to ambulate.  Initially on admission there was concern for CVA-MRI did not show any acute abnormality but did show previous episodes of ischemic insult.  Carotid Doppler showed moderate arthrosclerotic plaque at both carotid bifurcations-right ICA stenosis 50-69% and left ICA stenosis less than 50.  He was evaluated by neurolo diagnosed him with worsening Alzheimer's dementia and initiated Aricept-with recommendations to continue aspirin his blood work including B12 RPR TSH and homocystine levels were unremarkable.  He was evaluated by physical therapy who recommended skilled nursing.  Apparently there was also a history of questionable community-acquired pneumonia he received Rocephin and azithromycin initially-transitioned to Levaquin on March 3-he continues on this for a total 5 day course.  Patient also apparently had some high blood pressures in the hospital not on any medication-he was given clonidine when necessary so far his blood pressure appears to be  stable here.  4 malnutrition he is on Ensure this is followed by dietary.  Previous  medical history  history of acute encephalopathy apparently somewhat better with diagnosis of Alzheimer's dementia.  Weakness right lower extremity apparently this has improved somewhat but he still needs assistance.  Question community-acquired pneumonia.  Urinary incontinence.  Apparently he did have a chest x-ray that showed a questionable pleural plaque left upper lobe suggestion for followup chest x-ray in about 4 weeks  Right carotid artery stenosis.  Tobacco abuse.  Malnutrition moderate degree.  Hypertension?   Deconditioning  Surgical history-apparently there is some history of a foot operation .  Social history-limited information at this time apparently he has 2 sons one lives locally and one [place]-he is currently living with his ex-wife apparently in a roommate situation in an apartment.  Apparently before hospitalization he was smoking about a half a pack a day-apparently there was some history of alcoholism although apparently this has not been  An issue in the last 20 years or so.  Hospital studies.  10/21/2013.  EEG was within normal limits.  10/20/2013.  Cardiac ECHO data showed grade 1 diastolic dysfunction.  Carotid Doppler showed ICA stenosis right-sided 50-69%-left side less than 50%.  10/20/2013.  Chest x-ray-showed patchy areas of interstitial prominence throughout the mid to lower lungs bilaterally-question whether this is infection or sequela of mild aspiration.  Linear opacity in the periphery of the low upper left hemothorax may reflect scarring atelectasis or pleural plaque.  10/19/2013.  CT of the head-showed no acute process moderate small vessel disease-remote small frontal lobe  infarcts and right embolic infarct-no CT evidence of a large acute infarct-ventricular prominence may be related to atrophy difficult to  exclude a very mild component of hydrocephalus.  10/20/2013.  MR brain-no acute process chronic ischemic disease and cerebral volume loss.  Review of systems-essentially unobtainable from patient although he is not complaining of any pain shortness of breath chest discomfort he appears to be aadapting well to this facility although remains very confused    Medications.  Aspirin 325 mg daily.  Aricept 5 mg each bedtime.  Ensure feeding supplement twice a day.  Folic acid 1 mg daily.  Levaquin 750 mg daily until March 7.  Multivitamin daily.  Thiamine 100 mg daily.    Physical exam.  Temperature is 97.1 pulse 68 respirations 20 blood pressure 119/70 O2 saturation is 98% on room air.  In general this is a pleasant frail elderly male in no distress.  Skin is warm and dry.  Eyes sclera and conjunctiva are clear pupils appear reactive bilaterally visual acuity appears grossly intact he has prescription lenses.  Oropharynx is clear mucous membranes moist he is edentulous.  Heart is regular rate and rhythm without murmur gallop --he has trace lower extremity edema this is not very remarkable at this point.  Chest is clear to auscultation no labored breathing however his effort is quite poor.  Abdomen soft nontender with slightly reduced bowel sounds.  Muscle skeletal-moves all extremities x4 and is able to stand with assistance continues to be very frail and weak however I do not note any deformities-upper extremity strength appears to be grossly intact grip strength is strong bilaterally.  Note his nails on both feet appear to be quite unkept will need podiatry consult  Neurologic is grossly intact there are no lateralizing findings on exam this evening his speech is clear but he is confused.  Psych he is oriented to self only can follow some verbal commands-he is cooperative with exam.  Labs.  10/22/2013.  WBC 7.1 hemoglobin 13.4 platelets  196.    10/20/2013.  Sodium 141 potassium 3.9 BUN 8 creatinine 0.85.  Liver function tests within normal limits except albumin of 3.1 assessment and plan.   #1 for history of acute encephalopathy thought to be multifactorial dementia complicated with possible pneumonia-he has been started on Aricept by neurology-is also completing a course of Levaquin for suspected pneumonia clinically he appears to be stable although confused--respiratory-wise appears to be stable.  #2-question history of hypertension his blood pressure appears to be stable this evening we'll continue to monitor apparently t had clonidine when necessary in the hospital at this point does not appear to need this  . #3-question history of pleural plaque on x-ray--suggestion for another x-ray in 4 weeks for followup  #4--weakness and unsteady gait-he will need extensive therapy I this point would find it difficult for him to return to a independent living situation-with his confusion in physical weakness again will need extensive therapy this may be hindered by his dementia will continue to monitor.  #5-history of tobacco abuse-at this point patient is not smoking he may at some point benefit from a patch will continue to monitor for now he does not appear to be anxious about this or in any distress  #6-history of malnutrition of moderate degree with albumin of 3.1-this will be followed by dietary he is on supplementation.  #nail issues--foot care-we'll order a podiatry consult  Of note will update a CBC and basic metabolic panel first laboratory date next week clinically  appears stable although quite weak  [id number]-of note greater than 40 minutes spent assessing patient-reviewing his hospital records-and coordinating and formulating a plan of care for numerous diagnoses-of note rater than 50% of time spent coordinating plan of care.       .Marland Kitchen

## 2013-10-25 ENCOUNTER — Encounter: Payer: Self-pay | Admitting: Internal Medicine

## 2013-10-27 ENCOUNTER — Non-Acute Institutional Stay (SKILLED_NURSING_FACILITY): Payer: Medicare Other | Admitting: Internal Medicine

## 2013-10-27 DIAGNOSIS — I6990 Unspecified sequelae of unspecified cerebrovascular disease: Secondary | ICD-10-CM

## 2013-10-27 DIAGNOSIS — J189 Pneumonia, unspecified organism: Secondary | ICD-10-CM

## 2013-10-27 DIAGNOSIS — R269 Unspecified abnormalities of gait and mobility: Secondary | ICD-10-CM

## 2013-10-29 NOTE — Progress Notes (Signed)
Patient ID: Jason Pratt, male   DOB: [birth year], 80 y.o.   MRN: [id number] Facility; [facility] Chief complaint; admission to SNF post admit to [place] 3/1 through 3/4 History; this is an 80 year old man whose primary caregiver is his ex-wife. He has had for 2 weeks prior to admission worsening gait was also right facial droop and speech impairment. He has not had any seizure activities. She is also noted that his memory has been getting worse. On the day of admission he was noted to have weakness in his right leg and he could not bear weight.  MRI of the brain showed no acute abnormality but previous ischemic insults. Brother Dopplers with moderate atherosclerotic plaque at both carotid bifurcations. Right internal carotid stenosis is 50-59% 2. Neurology felt he had worsening Alzheimer's dementia and started Aricept 2. B12 RPR TSH and homocysteine levels were within normal limits. I don't believe anything acute was felt to be going on. He also had a questionable community-acquired pneumonia. He was given ceftriaxone and azithromycin. He is discharged on Levaquin to complete a five-day course. Strep and Legionella urinary antigens were negative.    TECHNIQUE: Multiplanar, multiecho pulse sequences of the brain and surrounding structures were obtained without intravenous contrast.   COMPARISON:  MR HEAD W/O CM dated 10/20/2013   FINDINGS: Today's exam was performed 36 hr after the previous MR.   No evidence for acute infarction, hemorrhage, mass lesion, hydrocephalus, or extra-axial fluid.   Advanced cerebral and cerebellar atrophy. Advanced chronic microvascular ischemic change in the periventricular and subcortical white matter. Remote large vessel infarct affects the left parietal region. Chronic deep gray matter lacunar infarcts, strictly left thalamus. Chronic hemorrhage right posterior frontal cortex. No acute sinus or mastoid disease. No significant change from yesterday.    IMPRESSION: Advanced atrophy with small vessel disease and evidence for previous multiple episodes of ischemic insult.   No acute intracranial findings.     Electronically Signed   By: Davonna Belling M.D.   On: 10/21/2013 18:14   Past Medical History  Diagnosis Date  . Hypertension   . Stenosis of right carotid artery 10/22/2013  . Alzheimer's dementia 10/22/2013  . CAP (community acquired pneumonia) 10/19/2013  . Acute encephalopathy 10/19/2013    Past Surgical History  Procedure Laterality Date  . Foot fracture surgery Left 1985   Current Outpatient Prescriptions on File Prior to Visit  Medication Sig Dispense Refill  . aspirin EC 325 MG EC tablet Take 1 tablet (325 mg total) by mouth daily.  30 tablet  0  . cloNIDine (CATAPRES) 0.1 MG tablet Take 1 tablet (0.1 mg total) by mouth 4 (four) times daily as needed (For greater than 160/100).  60 tablet  11  . donepezil (ARICEPT) 5 MG tablet Take 1 tablet (5 mg total) by mouth at bedtime.  30 tablet  0  . feeding supplement, ENSURE COMPLETE, (ENSURE COMPLETE) LIQD Take 237 mLs by mouth 2 (two) times daily between meals.      . folic acid (FOLVITE) 1 MG tablet Take 1 tablet (1 mg total) by mouth daily.      . Multiple Vitamin (MULTIVITAMIN WITH MINERALS) TABS tablet Take 1 tablet by mouth daily.      Marland Kitchen thiamine 100 MG tablet Take 1 tablet (100 mg total) by mouth daily.       No current facility-administered medications on file prior to visit.   Social: Lives with his ex wife. I am not sure of his exact functional status prior  to deterioration History   Social History  . Marital Status: Divorced    Spouse Name: N/A    Number of Children: N/A  . Years of Education: N/A   Occupational History  . Not on file.   Social History Main Topics  . Smoking status: Current Every Day Smoker -- 0.25 packs/day for 5 years    Types: Cigarettes  . Smokeless tobacco: Not on file  . Alcohol Use: No  . Drug Use: No  . Sexual Activity: No    Other Topics Concern  . Not on file   Social History Narrative  . No narrative on file   Review of Systems: Resp: no SOB CVS: No chest  Pain GI oral thrush on admission but no dyuspahgia or odynophagia GU; periods of incontinenece    Physical examination; Gen. pleasant man in no distress. HEENT oral thrush is better than the other day but still present on his lingual surface Respiratory clear entry bilaterally Cardiac heart sounds are normal no murmurs no carotid bruits Abdomen; no liver no spleen no tenderness GU bladder is not enlarged there is no costovertebral angle tenderness Extremities; peripheral pulses are palpable. Skin is dry extremely unkempt mycotic nails Neurologic; patient is able to move all his limbs. Hyperreflexic in the lower extremities both plantar responses are flexor. His speech is somewhat halting but clearly understood Gait; marked balance problems wide-based Mental status; the patient cannot answer a lot of his own personal questions although he is not completely oriented.  Impression/plan #1 probable multi-infarct dementia and multi-infarct state #2 severe gait ataxia probably related to #1 #3 apparent remotes alcohol history #4 community acquired pneumonia successfully treated #5 urinary incontinence will assess him for this see if we can do anything about this. Postvoid residual urine by bladder scan will be ordered #6 oral thrush; we initiated treatment of this this looks considerably better.  We'll have to see how this man doesn't therapy and his degree of social support with regards to a discharge to home vs. assisted living vs. continued need for skilled care

## 2013-11-03 ENCOUNTER — Encounter: Payer: Self-pay | Admitting: *Deleted

## 2013-11-05 ENCOUNTER — Non-Acute Institutional Stay (SKILLED_NURSING_FACILITY): Payer: Medicare Other | Admitting: Internal Medicine

## 2013-11-05 DIAGNOSIS — R32 Unspecified urinary incontinence: Secondary | ICD-10-CM

## 2013-11-05 NOTE — Progress Notes (Signed)
Patient ID: Jason Pratt, male   DOB: 06-09-1930, 80 y.o.   MRN: [id number]

## 2013-11-11 NOTE — Progress Notes (Signed)
Patient ID: Jason Pratt, male   DOB: Jul 20, 1930, 80 y.o.   MRN: [id number]                   PROGRESS NOTE  DATE:  11/05/2013    FACILITY: [facility]    LEVEL OF CARE:   SNF   Acute Visit   CHIEF COMPLAINT:  Follow up urinary incontinence.    HISTORY OF PRESENT ILLNESS:  This is a patient who came to us [facility] with acute confusion with superimposed community-acquired pneumonia.  He was treated for this.  His mental status seems to be back at baseline.   He did not have an acute stroke, although his MRI suggested significant cerebrovascular disease.    Nursing staff tell me that he occasionally voids continently and sometimes incontinently.  He appears to have very significant urgency.  Postvoid residual bladder scan is 86 cc.  He does not have large amounts of retained urine.    REVIEW OF SYSTEMS:   GU:  He is not complaining of dysuria or hematuria.   GI:  No nausea,  vomiting or abdominal pain.    PHYSICAL EXAMINATION:   GASTROINTESTINAL:  LIVER/SPLEEN/KIDNEYS:  No liver, no spleen.  No tenderness.   CARDIOVASCULAR:  CARDIAC:   Heart sounds are normal.  There is no evidence of CHF.  He appears to be euvolemic.   GENITOURINARY:  BLADDER:   Not distended, at least clinically.  There is no suprapubic or costovertebral angle tenderness.    ASSESSMENT/PLAN:  Urinary incontinence with extreme urgency.  At 86 cc, this is not a normal degree of postvoid residual.  However, I felt it might be possible to try him on a small dose of Ditropan and I put him on 5 mg in the morning and 5 mg at night.  I will follow up on his frequency of his incontinent versus continent voids and, if necessary, repeat a follow-up bladder ultrasound.    CPT CODE: [id number]

## 2013-11-12 ENCOUNTER — Non-Acute Institutional Stay (SKILLED_NURSING_FACILITY): Payer: Medicare Other | Admitting: Internal Medicine

## 2013-11-12 ENCOUNTER — Encounter: Payer: Self-pay | Admitting: Internal Medicine

## 2013-11-12 DIAGNOSIS — R319 Hematuria, unspecified: Secondary | ICD-10-CM

## 2013-11-12 DIAGNOSIS — R32 Unspecified urinary incontinence: Secondary | ICD-10-CM

## 2013-11-12 NOTE — Progress Notes (Signed)
Patient ID: Jason RevereOlen Pratt, male   DOB: 30-Jul-1930, 80 y.o.   MRN: [id number]  This encounter was created in error - please disregard.

## 2013-11-12 NOTE — Progress Notes (Signed)
Patient ID: Jason Pratt, male   DOB: 05/14/1930, 80 y.o.   MRN: [id number]  Facility; [facility] History; I am following him [name] today with regards to his urinary frequency and incontinence. 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 He came to the facility after a stay at the hospital with new neurologic findings an MRI of the brain showed no acute abnormality but previous ischemic insults. We noted that he had some episodes of urinary incontinence some of condiment voids in the urinal but he had marked urinary frequency. We did a bladder ultrasound which showed 86 cc however he apparently has been having a lot of difficulty voiding undeterred pan 5 mg twice a day. He was reported to me this morning that he had not voided since last night the bladder scan at that time was 377 cc. Before I saw him he actually voided incontinently a large amount.  Review of systems is not really possible however it is also been noted that he had blood in his urine. A urinalysis showed moderate amount of blood a few bacteria a culture was negative for. Significant growth  Physical examination; Respiratory clear entry bilaterally Cardiac heart sounds are normal no murmurs no signs of fluid overload GU his bladder is not distended there is no suprapubic or costovertebral angle tenderness  Impression/plan #1 urinary frequency. I think the Ditropan is causing some degree of voiding difficulty and urinary retention not reduce this to just at night.  #2 hematuria which was apparently visible to the staff that there is going to need to see urology. #3 he had an original postvoid residual [before Ditropan] of 86 cc of. His prostate will need to be checked.

## 2013-11-20 ENCOUNTER — Inpatient Hospital Stay (HOSPITAL_COMMUNITY): Payer: Medicare Other | Attending: Internal Medicine

## 2013-11-26 ENCOUNTER — Encounter (HOSPITAL_COMMUNITY): Payer: Self-pay | Admitting: Pharmacy Technician

## 2013-11-28 ENCOUNTER — Encounter (HOSPITAL_COMMUNITY): Payer: Self-pay

## 2013-11-28 ENCOUNTER — Ambulatory Visit (HOSPITAL_COMMUNITY)
Admit: 2013-11-28 | Discharge: 2013-11-28 | Disposition: A | Discharge status: Home / Self Care | Payer: Medicare Other | Source: Ambulatory Visit | Attending: Urology | Admitting: Urology

## 2013-11-28 DIAGNOSIS — I714 Abdominal aortic aneurysm, without rupture, unspecified: Secondary | ICD-10-CM | POA: Insufficient documentation

## 2013-11-28 DIAGNOSIS — Q6101 Congenital single renal cyst: Secondary | ICD-10-CM | POA: Insufficient documentation

## 2013-11-28 DIAGNOSIS — K5730 Diverticulosis of large intestine without perforation or abscess without bleeding: Secondary | ICD-10-CM | POA: Insufficient documentation

## 2013-11-28 DIAGNOSIS — R32 Unspecified urinary incontinence: Secondary | ICD-10-CM | POA: Insufficient documentation

## 2013-11-28 DIAGNOSIS — N400 Enlarged prostate without lower urinary tract symptoms: Secondary | ICD-10-CM | POA: Insufficient documentation

## 2013-11-28 MED ORDER — IOHEXOL 300 MG/ML  SOLN
125.0000 mL | Freq: Once | INTRAMUSCULAR | Status: AC | PRN
  Administered 2013-11-28: 125 mL via INTRAVENOUS

## 2013-12-02 NOTE — H&P (Signed)
Urology History and Physical Exam  CC: Urinary incontinence. His legs are weak and he cannot stand. He is taking rehab and having exercise so I decided to do flexible cystoscopy in hospital under anesthesia. He is a poor historian and his PSA is 6.5 but free psa is 23.5 and he has enlarged prostate and CT shows The bladder has thick wall maybe associated with BPH. He is scheduled to have cysto under local anesthesia as outpatient.   HPI: 80 y.o. year old male   PMH: Past Medical History  Diagnosis Date  . Hypertension   . Stenosis of right carotid artery 10/22/2013  . Alzheimer's dementia 10/22/2013  . CAP (community acquired pneumonia) 10/19/2013  . Acute encephalopathy 10/19/2013    PSH: Past Surgical History  Procedure Laterality Date  . Foot fracture surgery Left 1985    Allergies: No Known Allergies  Medications: Prescriptions prior to admission  Medication Sig Dispense Refill  . aspirin EC 325 MG EC tablet Take 1 tablet (325 mg total) by mouth daily.  30 tablet  0  . donepezil (ARICEPT) 5 MG tablet Take 1 tablet (5 mg total) by mouth at bedtime.  30 tablet  0  . folic acid (FOLVITE) 1 MG tablet Take 1 tablet (1 mg total) by mouth daily.      . [EXPIRED] levofloxacin (LEVAQUIN) 750 MG tablet Take 1 tablet (750 mg total) by mouth daily.  3 tablet  0  . Multiple Vitamin (MULTIVITAMIN WITH MINERALS) TABS tablet Take 1 tablet by mouth daily.      Marland Kitchen. oxybutynin (DITROPAN) 5 MG tablet Take 5 mg by mouth at bedtime.      . thiamine 100 MG tablet Take 1 tablet (100 mg total) by mouth daily.         Social History: History   Social History  . Marital Status: Divorced    Spouse Name: N/A    Number of Children: N/A  . Years of Education: N/A   Occupational History  . Not on file.   Social History Main Topics  . Smoking status: Current Every Day Smoker -- 0.25 packs/day for 5 years    Types: Cigarettes  . Smokeless tobacco: Not on file  . Alcohol Use: No  . Drug Use: No  .  Sexual Activity: No   Other Topics Concern  . Not on file   Social History Narrative  . No narrative on file    Family History: Family History  Problem Relation Age of Onset  . Lung cancer Sister     Review of Systems: Positive:  Negative:  A further 10 point review of systems was negative except what is listed in                 the HPI.  Physical Exam: @VITALS2 @ General: No acute distress.  Awake. Head:  Normocephalic.  Atraumatic. ENT:  EOMI.  Mucous membranes moist Neck:  Supple.  No lymphadenopathy. CV:  S1 present. S2 present. Regular rate. Pulmonary: Equal effort bilaterally.  Clear to auscultation bilaterally. Abdomen: Soft non tender,non distended. Skin:  Normal turgor.  No visible rash. Extremity: No gross deformity of bilateral upper extremities.  No gross deformity of                             lower extremities. Neurologic: Alert. Appropriate mood.  Penis:  circumcised.  No lesions. Urethra: Orthotopic meatus. Scrotum: No lesions.  No ecchymosis.  No  erythema. Testicles: Descended bilaterally.  No masses bilaterally. Epididymis: Palpable bilaterally. Nontender to palpation.  Studies:  No results found for this basename: HGB, WBC, PLT,  in the last 72 hours  No results found for this basename: NA, K, CL, CO2, BUN, CREATININE, CALCIUM, MAGNESIUM, GFRNONAA, GFRAA,  in the last 72 hours   No results found for this basename: PT, INR, APTT,  in the last 72 hours   No components found with this basename: ABG,     Assessment:  Elevated PSA; urinary incontinence; ? BPH Plan: Flexible cysto under local anesthesia  Ky BarbanMohammad I Renner Sebald  12/02/2013 12:58 PM

## 2013-12-04 ENCOUNTER — Encounter (HOSPITAL_COMMUNITY): Payer: Self-pay

## 2013-12-04 ENCOUNTER — Inpatient Hospital Stay
Admission: RE | Admit: 2013-12-04 | Discharge: 2013-12-13 | Disposition: A | Discharge status: Home / Self Care | Payer: Medicaid Other | Source: Ambulatory Visit | Attending: Internal Medicine | Admitting: Internal Medicine

## 2013-12-04 ENCOUNTER — Encounter (HOSPITAL_COMMUNITY)
Admission: RE | Disposition: A | Discharge status: Skilled Nursing Facility | Payer: Self-pay | Source: Ambulatory Visit | Attending: Urology

## 2013-12-04 ENCOUNTER — Ambulatory Visit (HOSPITAL_COMMUNITY)
Admission: RE | Admit: 2013-12-04 | Discharge: 2013-12-04 | Disposition: A | Discharge status: Skilled Nursing Facility | Payer: Medicare Other | Source: Ambulatory Visit | Attending: Urology | Admitting: Urology

## 2013-12-04 DIAGNOSIS — I10 Essential (primary) hypertension: Secondary | ICD-10-CM | POA: Insufficient documentation

## 2013-12-04 DIAGNOSIS — Z79899 Other long term (current) drug therapy: Secondary | ICD-10-CM | POA: Insufficient documentation

## 2013-12-04 DIAGNOSIS — Z7982 Long term (current) use of aspirin: Secondary | ICD-10-CM | POA: Insufficient documentation

## 2013-12-04 DIAGNOSIS — N342 Other urethritis: Secondary | ICD-10-CM | POA: Insufficient documentation

## 2013-12-04 DIAGNOSIS — R32 Unspecified urinary incontinence: Secondary | ICD-10-CM | POA: Insufficient documentation

## 2013-12-04 HISTORY — PX: CYSTOSCOPY: SHX5120

## 2013-12-04 SURGERY — CYSTOSCOPY, FLEXIBLE
Anesthesia: LOCAL | Site: Bladder

## 2013-12-04 MED ORDER — STERILE WATER FOR IRRIGATION IR SOLN
Status: DC | PRN
Start: 2013-12-04 — End: 2013-12-04
  Administered 2013-12-04: 1000 mL

## 2013-12-04 MED ORDER — LIDOCAINE HCL 2 % EX GEL
CUTANEOUS | Status: AC
  Filled 2013-12-04: qty 10

## 2013-12-04 MED ORDER — CIPROFLOXACIN HCL 250 MG PO TABS: 250.0000 mg | ORAL_TABLET | Freq: Two times a day (BID) | ORAL | Status: AC

## 2013-12-04 MED ORDER — SODIUM CHLORIDE 0.9 % IV SOLN
INTRAVENOUS | Status: DC | PRN
  Administered 2013-12-04 (×2): 1000 mL

## 2013-12-04 MED ORDER — LIDOCAINE HCL 2 % EX GEL
CUTANEOUS | Status: DC | PRN
Start: 2013-12-04 — End: 2013-12-04
  Administered 2013-12-04: 1 via URETHRAL

## 2013-12-04 SURGICAL SUPPLY — 24 items
CATH ROBINSON RED A/P 14FR (CATHETERS) ×2 IMPLANT
CLOTH BEACON ORANGE TIMEOUT ST (SAFETY) ×2 IMPLANT
DRAPE LAPAROTOMY TRNSV 102X78 (DRAPE) ×2 IMPLANT
DRAPE PROXIMA HALF (DRAPES) ×2 IMPLANT
GLOVE BIO SURGEON STRL SZ7 (GLOVE) ×2 IMPLANT
GLOVE BIOGEL PI IND STRL 7.0 (GLOVE) ×1 IMPLANT
GLOVE BIOGEL PI IND STRL 8.5 (GLOVE) ×1 IMPLANT
GLOVE BIOGEL PI INDICATOR 7.0 (GLOVE) ×1
GLOVE BIOGEL PI INDICATOR 8.5 (GLOVE) ×1
GLOVE ECLIPSE 8.5 STRL (GLOVE) ×2 IMPLANT
GLOVE SS BIOGEL STRL SZ 6.5 (GLOVE) ×1 IMPLANT
GLOVE SUPERSENSE BIOGEL SZ 6.5 (GLOVE) ×1
GOWN STRL REUS W/TWL LRG LVL3 (GOWN DISPOSABLE) ×6 IMPLANT
IV NS 1000ML (IV SOLUTION) ×2
IV NS 1000ML BAXH (IV SOLUTION) ×2 IMPLANT
MARKER SKIN DUAL TIP RULER LAB (MISCELLANEOUS) ×2 IMPLANT
SET IRRIG Y TYPE TUR BLADDER L (SET/KITS/TRAYS/PACK) ×2 IMPLANT
SPONGE GAUZE 4X4 12PLY (GAUZE/BANDAGES/DRESSINGS) ×2 IMPLANT
SYR TOOMEY 50ML (SYRINGE) ×2 IMPLANT
TOWEL OR 17X26 4PK STRL BLUE (TOWEL DISPOSABLE) ×2 IMPLANT
TRAY FOLEY CATH 16FR SILVER (SET/KITS/TRAYS/PACK) IMPLANT
VALVE DISPOSABLE (MISCELLANEOUS) ×2 IMPLANT
WATER STERILE IRR 1000ML POUR (IV SOLUTION) ×2 IMPLANT
YANKAUER SUCT BULB TIP 10FT TU (MISCELLANEOUS) ×2 IMPLANT
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
## 2013-12-04 NOTE — Brief Op Note (Signed)
12/04/2013  10:19 AM  PATIENT:  Jason Pratt  80 y.o. male  PRE-OPERATIVE DIAGNOSIS:  benign prostatic hypertrophy, urinary incontinence  POST-OPERATIVE DIAGNOSIS:  benign prostatic hypertrophy, urinary incontinence  PROCEDURE:  Procedure(s): CYSTOSCOPY FLEXIBLE CYSTOSCOPY (RIGID)  SURGEON:  Surgeon(s) and Role:    * Ky BarbanMohammad I Ezra Marquess, MD - Primary  PHYSICIAN ASSISTANT:   ASSISTANTS: none   ANESTHESIA:   local  EBL:     BLOOD ADMINISTERED:none  DRAINS: none   LOCAL MEDICATIONS USED:  NONE  SPECIMEN:  No Specimen  DISPOSITION OF SPECIMEN:  N/A  COUNTS:  YES  TOURNIQUET:  * No tourniquets in log *  DICTATION: .Other Dictation: Dictation Number dictation [id number]  PLAN OF CARE: Discharge to home after PACU  PATIENT DISPOSITION:  PACU - hemodynamically stable.   Delay start of Pharmacological VTE agent (>24hrs) due to surgical blood loss or risk of bleeding:
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
## 2013-12-04 NOTE — Discharge Instructions (Signed)
Call for fever or bleeding or inability to void. Cystoscopy, Care After Refer to this sheet in the next few weeks. These instructions provide you with information on caring for yourself after your procedure. Your caregiver may also give you more specific instructions. Your treatment has been planned according to current medical practices, but problems sometimes occur. Call your caregiver if you have any problems or questions after your procedure. HOME CARE INSTRUCTIONS  Things you can do to ease any discomfort after your procedure include:  Drinking enough water and fluids to keep your urine clear or pale yellow.  Taking a warm bath to relieve any burning feelings. SEEK IMMEDIATE MEDICAL CARE IF:   You have an increase in blood in your urine.  You notice blood clots in your urine.  You have difficulty passing urine.  You have the chills.  You have abdominal pain.  You have a fever or persistent symptoms for more than 2 3 days.  You have a fever and your symptoms suddenly get worse. MAKE SURE YOU:   Understand these instructions.  Will watch your condition.  Will get help right away if you are not doing well or get worse. Document Released: 02/24/2005 Document Revised: 04/09/2013 Document Reviewed: 01/29/2012 [facility] Patient Information 2014 [employer] [place].

## 2013-12-04 NOTE — Progress Notes (Signed)
No change in H7p on reexamination.

## 2013-12-05 NOTE — Op Note (Signed)
NAME:  Jason Pratt, [name]                  ACCOUNT NO.:  [id number]  MEDICAL RECORD NO.:  [id number]  LOCATION:                                 FACILITY:  PHYSICIAN:  Ky BarbanMohammad I. Aarin Bluett, M.D.DATE OF BIRTH:  [birth year]  DATE OF PROCEDURE:  12/04/2013 DATE OF DISCHARGE:  12/04/2013                              OPERATIVE REPORT   PREOPERATIVE DIAGNOSIS:  Urinary incontinence.  POSTOPERATIVE DIAGNOSIS:  Posterior urethritis.  PROCEDURE:  Cystoscopy attempt with flexible rigid scope.  DESCRIPTION OF PROCEDURE:  The patient was placed in supine position. After usual prep and drape, Xylocaine jelly instilled into the urethra. Flexible cystoscope was introduced with some difficulty, I went through the prostatic urethra.  There was a large is residual urine about 300 mL, which I checked after removing the cystoscope.  He voided with poor stream.  Then, straight catheter was introduced and we got 300 mL of residual urine.  I tried to look into the prostatic urethra, but was unable to really ascertain whether he has enlarged prostate or not causing bladder neck obstruction, but I tried the rigid scope.  Also, I could not do a good examination.  I could never look into the bladder. I got into the bladder, but I could not never look into it to determine if there is any problem in the bladder, so I decided to remove the cystoscope and bring him back to do cystoscopy under anesthesia at a later date.  The patient left the operating room in satisfactory condition.     Ky BarbanMohammad I. Markian Glockner, M.D.     MIJ/MEDQ  D:  12/04/2013  T:  12/05/2013  Job:  [id number]

## 2013-12-08 ENCOUNTER — Encounter (HOSPITAL_COMMUNITY): Payer: Self-pay | Admitting: Urology

## 2013-12-11 ENCOUNTER — Encounter: Payer: Self-pay | Admitting: Internal Medicine

## 2013-12-11 ENCOUNTER — Non-Acute Institutional Stay (SKILLED_NURSING_FACILITY): Payer: Medicare Other | Admitting: Internal Medicine

## 2013-12-11 DIAGNOSIS — E440 Moderate protein-calorie malnutrition: Secondary | ICD-10-CM

## 2013-12-11 DIAGNOSIS — G309 Alzheimer's disease, unspecified: Secondary | ICD-10-CM

## 2013-12-11 DIAGNOSIS — G9340 Encephalopathy, unspecified: Secondary | ICD-10-CM

## 2013-12-11 DIAGNOSIS — R9389 Abnormal findings on diagnostic imaging of other specified body structures: Secondary | ICD-10-CM

## 2013-12-11 DIAGNOSIS — F0280 Dementia in other diseases classified elsewhere without behavioral disturbance: Secondary | ICD-10-CM

## 2013-12-11 DIAGNOSIS — R918 Other nonspecific abnormal finding of lung field: Secondary | ICD-10-CM
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
## 2013-12-24 ENCOUNTER — Encounter (HOSPITAL_COMMUNITY)
Admission: RE | Admit: 2013-12-24 | Discharge: 2013-12-24 | Disposition: A | Discharge status: Home / Self Care | Payer: Medicare Other | Source: Ambulatory Visit | Attending: Urology | Admitting: Urology

## 2013-12-24 ENCOUNTER — Encounter (HOSPITAL_COMMUNITY): Payer: Self-pay

## 2013-12-24 ENCOUNTER — Non-Acute Institutional Stay (SKILLED_NURSING_FACILITY): Payer: Medicare Other | Admitting: Internal Medicine

## 2013-12-24 DIAGNOSIS — R32 Unspecified urinary incontinence: Secondary | ICD-10-CM

## 2013-12-24 DIAGNOSIS — I6320 Cerebral infarction due to unspecified occlusion or stenosis of unspecified precerebral arteries: Secondary | ICD-10-CM

## 2013-12-24 DIAGNOSIS — R269 Unspecified abnormalities of gait and mobility: Secondary | ICD-10-CM

## 2013-12-24 NOTE — Progress Notes (Signed)
Patient ID: Jason Pratt, male   DOB: 12/28/1929, 80 y.o.   MRN: [id number]  Facility; [facility] Chief complaint; [facility] History; this patient was a patient I admitted in early March 2 [facility]. He had been admitted to hospital for workup of worsening gait, right facial droop and speech impairment. He was essentially diagnosed with a multi-infarct state [see MRI be low] .  MRI of the brain showed no acute abnormality but previous ischemic insults.Carotid Dopplers with moderate atherosclerotic plaque at both carotid bifurcations. Right internal carotid stenosis is 50-59% 2. Neurology felt he had worsening Alzheimer's dementia and started Aricept 2. B12 RPR TSH and homocysteine levels were within normal limits. I don't believe anything acute was felt to be going on. He also had a questionable community-acquired pneumonia. He was given ceftriaxone and azithromycin. He is discharged on Levaquin to complete a five-day course. Strep and Legionella urinary antigens were negative.  He has now been transferred to another skilled facility. I am not exactly certain of the issue. The patient does not even seem to remember the other facility    TECHNIQUE: Multiplanar, multiecho pulse sequences of the brain and surrounding structures were obtained without intravenous contrast.   COMPARISON:  MR HEAD W/O CM dated 10/20/2013   FINDINGS: Today's exam was performed 36 hr after the previous MR.   No evidence for acute infarction, hemorrhage, mass lesion, hydrocephalus, or extra-axial fluid.   Advanced cerebral and cerebellar atrophy. Advanced chronic microvascular ischemic change in the periventricular and subcortical white matter. Remote large vessel infarct affects the left parietal region. Chronic deep gray matter lacunar infarcts, strictly left thalamus. Chronic hemorrhage right posterior frontal cortex. No acute sinus or mastoid disease. No significant change  from yesterday.   IMPRESSION: Advanced atrophy with small vessel disease and evidence for previous multiple episodes of ischemic insult.   No acute intracranial findings.     Electronically Signed   By: Davonna BellingJohn  Pratt M.D.   On: 10/21/2013 18:14   Past Medical History  Diagnosis Date  . Hypertension   . Stenosis of right carotid artery 10/22/2013  . Alzheimer's dementia 10/22/2013  . CAP (community acquired pneumonia) 10/19/2013  . Acute encephalopathy 10/19/2013    Past Surgical History  Procedure Laterality Date  . Foot fracture surgery Left 1985   Current Outpatient Prescriptions on File Prior to Visit  Medication Sig Dispense Refill  . aspirin EC 325 MG EC tablet Take 1 tablet (325 mg total) by mouth daily.  30 tablet  0  . cloNIDine (CATAPRES) 0.1 MG tablet Take 1 tablet (0.1 mg total) by mouth 4 (four) times daily as needed (For greater than 160/100).  60 tablet  11  . donepezil (ARICEPT) 10 MG tablet Take 1 tablet (10 mg total) by mouth at bedtime.  30 tablet  0  . feeding supplement, ENSURE COMPLETE, (ENSURE COMPLETE) LIQD Take 237 mLs by mouth 2 (two) times daily between meals.      . folic acid (FOLVITE) 1 MG tablet Take 1 tablet (1 mg total) by mouth daily.      . Multiple Vitamin (MULTIVITAMIN WITH MINERALS) TABS tablet Take 1 tablet by mouth daily.      Marland Kitchen. thiamine 100 MG tablet namenda 5 mg bid Take 1 tablet (100 mg total) by mouth daily.       No current facility-administered medications on file prior to visit.   Social: Lives with his ex wife. I am not sure  of his exact functional status prior to deterioration History   Social History  . Marital Status: Divorced    Spouse Name: N/A    Number of Children: N/A  . Years of Education: N/A   Occupational History  . Not on file.   Social History Main Topics  . Smoking status: Current Every Day Smoker -- 0.25 packs/day for 5 years    Types: Cigarettes  . Smokeless tobacco: Not on file  . Alcohol Use: No  . Drug  Use: No  . Sexual Activity: No   Other Topics Concern  . Not on file   Social History Narrative  . No narrative on file   Review of Systems: Resp: no SOB CVS: No chest  Pain GI oral thrush on admission but no dyuspahgia or odynophagia GU; periods of incontinenece    Physical examination; Gen. pleasant man in no distress. HEENT very poor dentition Respiratory clear entry bilaterally Cardiac heart sounds are normal no murmurs no carotid bruits Abdomen; no liver no spleen no tenderness GU bladder is not enlarged there is no costovertebral angle tenderness Extremities; peripheral pulses are palpable. Skin is dry extremely unkempt mycotic nails Neurologic; patient is able to move all his limbs. Hyperreflexic in the lower extremities both plantar responses are flexor. His speech is somewhat halting but clearly understood Gait; marked balance problems wide-based Mental status; the patient cannot answer a lot of his own personal questions although he is not completely oriented.  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 The neurologist felt that he had Alzheimer's disease as well although I'm not sure on what basis  #2 severe gait ataxia probably related to #1 #3 apparent remotes alcohol history #4 community acquired pneumonia successfully treated #5 urinary incontinence will assess him for this see if we can do anything about this, he is on oxybutynin  5 mg at bedtime  The patient has not really changed that much from when I first saw him bleed he was being cared for at home by his ex-wife. The social situation was not stable. Will need to be vigilant about depression. At this point I see no reason for thiamine. I am ambibalent about the [facility] Incamenda   We'll have to see how this man doesn't therapy and his degree of social support with regards to a discharge to home vs. assisted living vs. continued need for skilled  care

## 2013-12-24 NOTE — Pre-Procedure Instructions (Signed)
Jason BachelorBrenda Pratt patients ex-wife calls about patients surgery on 12/30/2013. [name] and to contact Jason Pratt there for patients time to be here for surgery. She states patient had no POA and despite Dx of Alzheimers he is able to sign for himself, states he is aware of whats going on with him. [name] told her to have patient here at [id number] 12/30/2013 NPO, after midnight except for Namenda and Ditropan with sip of water. She states [name] but that his ex-wife, Jason Pratt will be here with hin during surgery. Jason Pratt also states that patient has no POA and can sign his own consent.

## 2013-12-30 ENCOUNTER — Ambulatory Visit (HOSPITAL_COMMUNITY): Payer: Medicare Other | Admitting: Anesthesiology

## 2013-12-30 ENCOUNTER — Encounter (HOSPITAL_COMMUNITY): Payer: Self-pay | Admitting: Anesthesiology

## 2013-12-30 ENCOUNTER — Ambulatory Visit (HOSPITAL_COMMUNITY)
Admission: RE | Admit: 2013-12-30 | Discharge: 2013-12-30 | Disposition: A | Discharge status: Other Acute Inpatient Hospital | Payer: Medicare Other | Source: Ambulatory Visit | Attending: Urology | Admitting: Urology

## 2013-12-30 ENCOUNTER — Encounter (HOSPITAL_COMMUNITY)
Admission: RE | Disposition: A | Discharge status: Nursing Home (Includes ICF) | Payer: Self-pay | Source: Ambulatory Visit | Attending: Urology

## 2013-12-30 ENCOUNTER — Encounter (HOSPITAL_COMMUNITY): Payer: Medicare Other | Admitting: Anesthesiology

## 2013-12-30 DIAGNOSIS — I6990 Unspecified sequelae of unspecified cerebrovascular disease: Secondary | ICD-10-CM | POA: Insufficient documentation

## 2013-12-30 DIAGNOSIS — N138 Other obstructive and reflux uropathy: Secondary | ICD-10-CM | POA: Insufficient documentation

## 2013-12-30 DIAGNOSIS — R32 Unspecified urinary incontinence: Secondary | ICD-10-CM | POA: Diagnosis not present

## 2013-12-30 DIAGNOSIS — N368 Other specified disorders of urethra: Secondary | ICD-10-CM | POA: Diagnosis not present

## 2013-12-30 DIAGNOSIS — F17200 Nicotine dependence, unspecified, uncomplicated: Secondary | ICD-10-CM | POA: Insufficient documentation

## 2013-12-30 DIAGNOSIS — G309 Alzheimer's disease, unspecified: Secondary | ICD-10-CM | POA: Insufficient documentation

## 2013-12-30 DIAGNOSIS — N401 Enlarged prostate with lower urinary tract symptoms: Secondary | ICD-10-CM | POA: Insufficient documentation

## 2013-12-30 DIAGNOSIS — Z79899 Other long term (current) drug therapy: Secondary | ICD-10-CM | POA: Insufficient documentation

## 2013-12-30 DIAGNOSIS — F0280 Dementia in other diseases classified elsewhere without behavioral disturbance: Secondary | ICD-10-CM | POA: Insufficient documentation

## 2013-12-30 DIAGNOSIS — I10 Essential (primary) hypertension: Secondary | ICD-10-CM | POA: Insufficient documentation

## 2013-12-30 DIAGNOSIS — I739 Peripheral vascular disease, unspecified: Secondary | ICD-10-CM | POA: Diagnosis not present

## 2013-12-30 HISTORY — PX: FULGURATION OF BLADDER TUMOR: SHX6261

## 2013-12-30 HISTORY — PX: CYSTOSCOPY: SHX5120

## 2013-12-30 SURGERY — CYSTOSCOPY
Anesthesia: Monitor Anesthesia Care | Site: Bladder

## 2013-12-30 MED ORDER — FENTANYL CITRATE 0.05 MG/ML IJ SOLN
INTRAMUSCULAR | Status: AC
  Filled 2013-12-30: qty 2

## 2013-12-30 MED ORDER — GLYCINE 1.5 % IR SOLN
Status: DC | PRN
  Administered 2013-12-30 (×2): 3000 mL

## 2013-12-30 MED ORDER — SODIUM CHLORIDE 0.9 % IR SOLN
Status: DC | PRN
  Administered 2013-12-30 (×2): 3000 mL via INTRAVESICAL

## 2013-12-30 MED ORDER — MIDAZOLAM HCL 2 MG/2ML IJ SOLN
INTRAMUSCULAR | Status: AC
  Filled 2013-12-30: qty 2

## 2013-12-30 MED ORDER — MIDAZOLAM HCL 2 MG/2ML IJ SOLN
1.0000 mg | INTRAMUSCULAR | Status: DC | PRN
  Administered 2013-12-30: 2 mg via INTRAVENOUS

## 2013-12-30 MED ORDER — CIPROFLOXACIN IN D5W 200 MG/100ML IV SOLN: 200.0000 mg | Freq: Once | INTRAVENOUS | Status: DC

## 2013-12-30 MED ORDER — FENTANYL CITRATE 0.05 MG/ML IJ SOLN: 25.0000 ug | INTRAMUSCULAR | Status: DC | PRN

## 2013-12-30 MED ORDER — CIPROFLOXACIN IN D5W 200 MG/100ML IV SOLN
INTRAVENOUS | Status: AC
  Filled 2013-12-30: qty 100

## 2013-12-30 MED ORDER — PROPOFOL 10 MG/ML IV BOLUS
INTRAVENOUS | Status: AC
  Filled 2013-12-30: qty 20

## 2013-12-30 MED ORDER — ONDANSETRON HCL 4 MG/2ML IJ SOLN: 4.0000 mg | Freq: Once | INTRAMUSCULAR | Status: DC | PRN

## 2013-12-30 MED ORDER — DEXTROSE 5 % IV SOLN
INTRAVENOUS | Status: DC | PRN
  Administered 2013-12-30: 11:00:00 via INTRAVENOUS

## 2013-12-30 MED ORDER — STERILE WATER FOR IRRIGATION IR SOLN
Status: DC | PRN
  Administered 2013-12-30: 1000 mL

## 2013-12-30 MED ORDER — FENTANYL CITRATE 0.05 MG/ML IJ SOLN
INTRAMUSCULAR | Status: DC | PRN
  Administered 2013-12-30 (×2): 12.5 ug via INTRAVENOUS

## 2013-12-30 MED ORDER — FENTANYL CITRATE 0.05 MG/ML IJ SOLN
25.0000 ug | INTRAMUSCULAR | Status: AC
  Administered 2013-12-30: 25 ug via INTRAVENOUS

## 2013-12-30 MED ORDER — PROPOFOL INFUSION 10 MG/ML OPTIME
INTRAVENOUS | Status: DC | PRN
  Administered 2013-12-30: 25 ug/kg/min via INTRAVENOUS

## 2013-12-30 MED ORDER — LIDOCAINE HCL 2 % EX GEL
CUTANEOUS | Status: DC | PRN
  Administered 2013-12-30: 1 via URETHRAL

## 2013-12-30 MED ORDER — LIDOCAINE HCL 2 % EX GEL
CUTANEOUS | Status: AC
  Filled 2013-12-30: qty 10

## 2013-12-30 MED ORDER — LACTATED RINGERS IV SOLN
INTRAVENOUS | Status: DC
  Administered 2013-12-30: 10:00:00 via INTRAVENOUS

## 2013-12-30 MED ORDER — CIPROFLOXACIN IN D5W 400 MG/200ML IV SOLN
INTRAVENOUS | Status: DC | PRN
  Administered 2013-12-30: 200 mg via INTRAVENOUS

## 2013-12-30 MED ORDER — 0.9 % SODIUM CHLORIDE (POUR BTL) OPTIME
TOPICAL | Status: DC | PRN
  Administered 2013-12-30: 2000 mL

## 2013-12-30 MED ORDER — MIDAZOLAM HCL 5 MG/5ML IJ SOLN
INTRAMUSCULAR | Status: DC | PRN
  Administered 2013-12-30 (×2): 1 mg via INTRAVENOUS

## 2013-12-30 SURGICAL SUPPLY — 21 items
BAG DRAIN URO TABLE W/ADPT NS (DRAPE) ×3 IMPLANT
BAG HAMPER (MISCELLANEOUS) ×3 IMPLANT
BAG URINE DRAINAGE (UROLOGICAL SUPPLIES) ×3 IMPLANT
CATH FOLEY 2WAY SLVR  5CC 20FR (CATHETERS) ×2
CATH FOLEY 2WAY SLVR 5CC 20FR (CATHETERS) ×1 IMPLANT
CLOTH BEACON ORANGE TIMEOUT ST (SAFETY) ×3 IMPLANT
GLOVE BIO SURGEON STRL SZ7 (GLOVE) ×6 IMPLANT
GLOVE BIOGEL M 6.5 STRL (GLOVE) ×3 IMPLANT
GLOVE BIOGEL PI IND STRL 7.0 (GLOVE) ×1 IMPLANT
GLOVE BIOGEL PI INDICATOR 7.0 (GLOVE) ×2
GLOVE EXAM NITRILE PF LG BLUE (GLOVE) ×3 IMPLANT
GOWN STRL REUS W/TWL LRG LVL3 (GOWN DISPOSABLE) ×3 IMPLANT
IV NS IRRIG 3000ML ARTHROMATIC (IV SOLUTION) ×6 IMPLANT
KIT ROOM TURNOVER AP CYSTO (KITS) ×3 IMPLANT
MANIFOLD NEPTUNE II (INSTRUMENTS) ×3 IMPLANT
NS IRRIG 1000ML POUR BTL (IV SOLUTION) ×6 IMPLANT
PACK CYSTO (CUSTOM PROCEDURE TRAY) ×3 IMPLANT
PAD ARMBOARD 7.5X6 YLW CONV (MISCELLANEOUS) ×3 IMPLANT
SET IRRIGATING DISP (SET/KITS/TRAYS/PACK) ×3 IMPLANT
TOWEL OR 17X26 4PK STRL BLUE (TOWEL DISPOSABLE) ×3 IMPLANT
WATER STERILE IRR 1000ML POUR (IV SOLUTION) ×3 IMPLANT

## 2013-12-30 NOTE — Progress Notes (Signed)
No change in H&P on reexamination. 

## 2013-12-30 NOTE — Anesthesia Postprocedure Evaluation (Signed)
  Anesthesia Post-op Note  Patient: Jason Pratt  Procedure(s) Performed: Procedure(s): CYSTOSCOPY AND ELLIK BLADDER IRRIGATION (N/A) FULGURATION OF BLADDER BLEEDERS (N/A)  Patient Location: PACU  Anesthesia Type:MAC  Level of Consciousness: awake, alert  and patient cooperative  Airway and Oxygen Therapy: Patient Spontanous Breathing  Post-op Pain: none  Post-op Assessment: Post-op Vital signs reviewed, Patient's Cardiovascular Status Stable, Respiratory Function Stable, Patent Airway, No signs of Nausea or vomiting and Pain level controlled  Post-op Vital Signs: Reviewed and stable  Last Vitals:  Filed Vitals:   12/30/13 1040  BP: 140/87  Pulse:   Temp:   Resp: 31    Complications: No apparent anesthesia complications

## 2013-12-30 NOTE — Brief Op Note (Signed)
12/30/2013  11:42 AM  PATIENT:  Jason Pratt  80 y.o. male  PRE-OPERATIVE DIAGNOSIS:  benign prostatic hypertrophy, urinary incontinence  POST-OPERATIVE DIAGNOSIS:  benign prostatic hypertrophy, urinary incontinence, bleeding from prostatic urethra  PROCEDURE:  Procedure(s): CYSTOSCOPY AND ELLIK BLADDER IRRIGATION (N/A) FULGURATION OF BLADDER BLEEDERS (N/A)  SURGEON:  Surgeon(s) and Role:    * Ky BarbanMohammad I Jermain Curt, MD - Primary  PHYSICIAN ASSISTANT:   ASSISTANTS: none   ANESTHESIA:   MAC  EBL:  Total I/O In: 400 [I.V.:400] Out: -   BLOOD ADMINISTERED:none  DRAINS: Urinary Catheter (Foley)   LOCAL MEDICATIONS USED:  NONE  SPECIMEN:  No Specimen  DISPOSITION OF SPECIMEN:  N/A  COUNTS:  YES  TOURNIQUET:  * No tourniquets in log *  DICTATION: .Other Dictation: Dictation Number dictation [id number]  PLAN OF CARE: Discharge to home after PACU  PATIENT DISPOSITION:  PACU - hemodynamically stable.   Delay start of Pharmacological VTE agent (>24hrs) due to surgical blood loss or risk of bleeding:

## 2013-12-30 NOTE — Transfer of Care (Signed)
Immediate Anesthesia Transfer of Care Note  Patient: Jason Pratt  Procedure(s) Performed: Procedure(s): CYSTOSCOPY AND ELLIK BLADDER IRRIGATION (N/A) FULGURATION OF BLADDER BLEEDERS (N/A)  Patient Location: PACU  Anesthesia Type:MAC  Level of Consciousness: awake and patient cooperative  Airway & Oxygen Therapy: Patient Spontanous Breathing and Patient connected to face mask oxygen  Post-op Assessment: Report given to PACU RN, Post -op Vital signs reviewed and stable and Patient moving all extremities  Post vital signs: Reviewed and stable  Complications: No apparent anesthesia complications

## 2013-12-30 NOTE — Progress Notes (Signed)
He had cysto under local about a month ago by me examination was not satisfactory i am bringing him back to do cysto under mac so that i can do better examination.

## 2013-12-30 NOTE — Discharge Instructions (Signed)
°  Call if bleeding or feverplease send urine c/s and send me the report in office.  Office visit with Dr. Jerre SimonJavaid in one week  Cystoscopy, Care After Refer to this sheet in the next few weeks. These instructions provide you with information on caring for yourself after your procedure. Your caregiver may also give you more specific instructions. Your treatment has been planned according to current medical practices, but problems sometimes occur. Call your caregiver if you have any problems or questions after your procedure. HOME CARE INSTRUCTIONS  Things you can do to ease any discomfort after your procedure include:  Drinking enough water and fluids to keep your urine clear or pale yellow.  Taking a warm bath to relieve any burning feelings. SEEK IMMEDIATE MEDICAL CARE IF:   You have an increase in blood in your urine.  You notice blood clots in your urine.  You have difficulty passing urine.  You have the chills.  You have abdominal pain.  You have a fever or persistent symptoms for more than 2 3 days.  You have a fever and your symptoms suddenly get worse. MAKE SURE YOU:   Understand these instructions.  Will watch your condition.  Will get help right away if you are not doing well or get worse.

## 2013-12-30 NOTE — Anesthesia Preprocedure Evaluation (Addendum)
Anesthesia Evaluation  Patient identified by MRN, date of birth, ID band Patient confused    Reviewed: Allergy & Precautions, H&P , NPO status , Patient's Chart, lab work & pertinent test results, Unable to perform ROS - Chart review only  Airway Mallampati: II TM Distance: >3 FB     Dental  (+) Poor Dentition, Missing, Chipped   Pulmonary pneumonia -, resolved, Current Smoker,  breath sounds clear to auscultation        Cardiovascular hypertension, Pt. on medications + Peripheral Vascular Disease Rhythm:Regular Rate:Normal     Neuro/Psych PSYCHIATRIC DISORDERS (Alzheimers dementia) CVA, Residual Symptoms    GI/Hepatic   Endo/Other    Renal/GU      Musculoskeletal   Abdominal   Peds  Hematology   Anesthesia Other Findings   Reproductive/Obstetrics                          Anesthesia Physical Anesthesia Plan  ASA: III  Anesthesia Plan: MAC   Post-op Pain Management:    Induction: Intravenous  Airway Management Planned: Simple Face Mask  Additional Equipment:   Intra-op Plan:   Post-operative Plan:   Informed Consent: I have reviewed the patients History and Physical, chart, labs and discussed the procedure including the risks, benefits and alternatives for the proposed anesthesia with the patient or authorized representative who has indicated his/her understanding and acceptance.     Plan Discussed with:   Anesthesia Plan Comments:         Anesthesia Quick Evaluation

## 2013-12-31 ENCOUNTER — Encounter (HOSPITAL_COMMUNITY): Payer: Self-pay | Admitting: Urology

## 2013-12-31 NOTE — Op Note (Signed)
NAME:  Jason Pratt, Jason Pratt                  ACCOUNT NO.:  [id number]  MEDICAL RECORD NO.:  [id number]  LOCATION:  [facility]                          FACILITY:  [facility]  PHYSICIAN:  Ky BarbanMohammad I. Reyce Lubeck, M.D.DATE OF BIRTH:  06/27/1930  DATE OF PROCEDURE: DATE OF DISCHARGE:  12/30/2013                              OPERATIVE REPORT   PREOPERATIVE DIAGNOSES:  Benign prostatic hypertrophy with recurrent urinary tract infection, hematuria.  POSTOPERATIVE DIAGNOSES:  Benign prostatic hypertrophy, bleeding from prostatic urethra.  DESCRIPTION OF PROCEDURE:  Patient who is having hematuria at the nursing home did inform me that he is passing blood in his urine, but anyway he was placed under MAC anesthesia in lithotomy position.  After usual prep and drape, #25 cystoscope introduced under direct vision. Anterior urethra looks normal.  Prostatic urethra is obstructed with lateral lobe hypertrophy.  He is bleeding from the prostatic urethra near the bladder neck and the bladder grossly looks normal.  I could not do a good bladder examination because of hematuria, but anyway I mean grossly with a right-angle and 4 oblique lens, I was able to look into the bladder.  There is no tumor, stone, or foreign body.  The cystoscope was removed, and I tried to fulgurate the bleeders with the Bugbee electrode because [name] was not available today. With Bugbee electrode, I was able to fulgurate some bleeders near the bladder neck. I think he will do better with Foley catheter, so cystoscope was removed.  Foley catheter #20 was left in for drainage and the urine looks relatively clear.  Patient left the operating room in satisfactory condition.  I did a digital rectal exam, his prostate is about 40-45 g, smooth and firm.  All the instruments were removed.  Patient left the operating room in satisfactory condition.     Ky BarbanMohammad I. Batul Diego, M.D.     MIJ/MEDQ  D:  12/30/2013  T:  12/31/2013  Job:  [id number]

## 2014-01-14 ENCOUNTER — Non-Acute Institutional Stay (SKILLED_NURSING_FACILITY): Payer: Medicare Other | Admitting: Internal Medicine

## 2014-01-14 DIAGNOSIS — R634 Abnormal weight loss: Secondary | ICD-10-CM

## 2014-01-14 DIAGNOSIS — R338 Other retention of urine: Principal | ICD-10-CM

## 2014-01-14 DIAGNOSIS — N401 Enlarged prostate with lower urinary tract symptoms: Secondary | ICD-10-CM

## 2014-01-14 DIAGNOSIS — R339 Retention of urine, unspecified: Secondary | ICD-10-CM

## 2014-01-19 NOTE — Progress Notes (Addendum)
Patient ID: Jason Pratt, male   DOB: 13-Nov-1929, 80 y.o.   MRN: [id number]                  PROGRESS NOTE  DATE:  01/14/2014    FACILITY: [facility]    LEVEL OF CARE:   SNF   Acute Visit   HISTORY OF PRESENT ILLNESS:  [name] is a gentleman who came here from [facility].  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  He also had been diagnosed with Alzheimer's disease by Neurology.    Apparently some time after we saw him here, he was discovered to be in urinary retention.  He went to Urology, had a Foley catheter inserted.  There is a note from the urologist suggesting that he has BPH with bladder outlet obstruction.  He was left with the Foley catheter in.   The catheter has sanguineous urine in it.    Also of concern is that he  apparently is not eating well.  He has lost 10 pounds since his arrival here.    Finally, he has a Neurology appointment tomorrow that his family was asking about, although I do not see a good explanation for this.      PHYSICAL EXAMINATION:   GENERAL APPEARANCE:  The patient does not look to be in any distress.   CHEST/RESPIRATORY:  Shallow air entry bilaterally.    CARDIOVASCULAR:  CARDIAC:   Heart sounds are normal.  He appears to be euvolemic.   GASTROINTESTINAL:  LIVER/SPLEEN/KIDNEYS:  No liver, no spleen.   ABDOMEN:  There is no guarding or rebound.   GENITOURINARY:  BLADDER:   He has a Foley catheter in place.  There is significant suprapubic tenderness, but no costovertebral angle tenderness is noted.    ASSESSMENT/PLAN:  Apparent urinary obstruction secondary to BPH.  Now with a Foley catheter in place.  He was put on doxycycline for a UTI, although the culture only showed Coag-negative Staph and bacterium which are not usually uropathogens.  I will repeat the urine culture.    Weight loss.   He is  apparently refusing to eat.  I do not have a great explanation for this.  He looks  somewhat depressed.  I will try him on a small dose of Remeron at night.

## 2014-04-17 ENCOUNTER — Other Ambulatory Visit (HOSPITAL_COMMUNITY): Payer: Self-pay | Admitting: Internal Medicine

## 2014-04-17 DIAGNOSIS — R1310 Dysphagia, unspecified: Secondary | ICD-10-CM

## 2014-04-22 ENCOUNTER — Other Ambulatory Visit (HOSPITAL_COMMUNITY): Payer: Medicare Other

## 2014-04-24 ENCOUNTER — Other Ambulatory Visit (HOSPITAL_COMMUNITY): Payer: Self-pay | Admitting: Internal Medicine

## 2014-04-24 DIAGNOSIS — R1310 Dysphagia, unspecified: Secondary | ICD-10-CM

## 2014-05-04 ENCOUNTER — Other Ambulatory Visit (HOSPITAL_COMMUNITY): Payer: Self-pay | Admitting: Internal Medicine

## 2014-05-04 ENCOUNTER — Ambulatory Visit (HOSPITAL_COMMUNITY)
Admission: RE | Admit: 2014-05-04 | Discharge: 2014-05-04 | Disposition: A | Discharge status: Home / Self Care | Payer: Medicare Other | Source: Ambulatory Visit | Attending: Internal Medicine | Admitting: Internal Medicine

## 2014-05-04 DIAGNOSIS — I10 Essential (primary) hypertension: Secondary | ICD-10-CM | POA: Diagnosis not present

## 2014-05-04 DIAGNOSIS — IMO0001 Reserved for inherently not codable concepts without codable children: Secondary | ICD-10-CM | POA: Diagnosis not present

## 2014-05-04 DIAGNOSIS — R1310 Dysphagia, unspecified: Secondary | ICD-10-CM | POA: Insufficient documentation

## 2014-05-07 ENCOUNTER — Encounter (HOSPITAL_COMMUNITY): Payer: Medicare Other | Admitting: Speech Pathology

## 2014-05-07 NOTE — Procedures (Signed)
Objective Swallowing Evaluation: Modified Barium Swallowing Study  Patient Details  Name: Jason Pratt MRN: [id number] Date of Birth: 07-27-1930  Today's Date: 05/04/2014 Time: 1:10 PM  - 1:42 PM    Past Medical History:  Past Medical History  Diagnosis Date  . Hypertension   . Stenosis of right carotid artery 10/22/2013  . Alzheimer's dementia 10/22/2013  . CAP (community acquired pneumonia) 10/19/2013  . Acute encephalopathy 10/19/2013   Past Surgical History:  Past Surgical History  Procedure Laterality Date  . Foot fracture surgery Left 1985  . Cystoscopy  12/04/2013    Procedure: CYSTOSCOPY FLEXIBLE AND PROSTATE EXAM;  Surgeon: Ky Barban, MD;  Location: [facility];  Service: Urology;;  . Cystoscopy  12/04/2013    Procedure: CYSTOSCOPY (RIGID);  Surgeon: Ky Barban, MD;  Location: [facility];  Service: Urology;;  . Cystoscopy N/A 12/30/2013    Procedure: CYSTOSCOPY AND ELLIK BLADDER IRRIGATION;  Surgeon: Ky Barban, MD;  Location: [facility];  Service: Urology;  Laterality: N/A;  . Fulguration of bladder tumor N/A 12/30/2013    Procedure: FULGURATION OF BLADDER BLEEDERS;  Surgeon: Ky Barban, MD;  Location: [facility];  Service: Urology;  Laterality: N/A;   HPI:  [name] is an 80 yo male referred by Dr. Renelda Loma for MBSS. He is currently residing at [facility] nursing facility and consuming a mechanical soft diet and thin liquids. Pt denies difficulty swallowing.     Recommendation/Prognosis  Clinical Impression:   Dysphagia Diagnosis: Mild pharyngeal phase dysphagia Clinical impression: Pt surprisingly presents with fairly normal for age oropharyngeal swallow function. Pt sounded congested and with mild wet vocal quality prior to exam suggestive of poor control of secretions, however pt only had one episode of flash penetration of thin liquids when taking straw sips thin. This was immediately expelled. Pt wanting to know whether he can have regular textures and SLP  stated that from what today's study showed, he likely could but that it was ultimately up to the treating SLP who could observe him over the course of a meal. Recommend D3/regular textures after trials with SLP and thin liquids.   Swallow Evaluation Recommendations:  Diet Recommendations: Regular;Dysphagia 3 (Mechanical Soft);Thin liquid Liquid Administration via: Cup;Straw Medication Administration: Whole meds with puree Supervision: Patient able to self feed Compensations: Slow rate Postural Changes and/or Swallow Maneuvers: Seated upright 90 degrees;Out of bed for meals;Upright 30-60 min after meal Oral Care Recommendations: Oral care BID Other Recommendations: Clarify dietary restrictions Follow up Recommendations: Skilled Nursing facility    Prognosis:  Prognosis for Safe Diet Advancement: Good   Individuals Consulted: Consulted and Agree with Results and Recommendations: Patient    General: Date of Onset: 05/03/14 Type of Study: Modified Barium Swallowing Study Reason for Referral: Objectively evaluate swallowing function Diet Prior to this Study: Dysphagia 3 (soft);Thin liquids Temperature Spikes Noted: No Respiratory Status: Room air History of Recent Intubation: No Behavior/Cognition: Alert;Cooperative;Pleasant mood Oral Motor / Sensory Function: Within functional limits Self-Feeding Abilities: Able to feed self Patient Positioning: Upright in chair Baseline Vocal Quality: Clear;Wet Volitional Cough: Congested Volitional Swallow: Able to elicit Anatomy: Within functional limits Pharyngeal Secretions: Not observed secondary MBS   Reason for Referral:   Objectively evaluate swallowing function    Oral Phase: Oral Preparation/Oral Phase Oral Phase: WFL (mildly prolonged oral phase)   Pharyngeal Phase:  Pharyngeal Phase Pharyngeal Phase: Impaired Pharyngeal - Thin Pharyngeal - Thin Cup: Delayed swallow initiation;Premature spillage to valleculae Pharyngeal - Thin  Straw: Delayed  swallow initiation;Premature spillage to valleculae;Premature spillage to pyriform sinuses;Penetration/Aspiration during swallow Penetration/Aspiration details (thin straw): Material does not enter airway;Material enters airway, remains ABOVE vocal cords then ejected out Pharyngeal - Solids Pharyngeal - Puree: Within functional limits Pharyngeal - Regular: Within functional limits Pharyngeal - Pill: Within functional limits   Cervical Esophageal Phase  Cervical Esophageal Phase Cervical Esophageal Phase: [facility]   GN  Functional Assessment Tool Used: MBSS Functional Limitations: Swallowing Swallow Current Status (E9528): At least 1 percent but less than 20 percent impaired, limited or restricted Swallow Goal Status [phone]): At least 1 percent but less than 20 percent impaired, limited or restricted Swallow Discharge Status [phone]): At least 1 percent but less than 20 percent impaired, limited or restricted      Thank you,  Havery Moros, CCC-SLP [phone]   Jason Pratt 05/04/2014, 5:24 PM

## 2014-07-21 DEATH — deceased

## 2015-01-27 IMAGING — CT CT ABD-PEL WO/W CM
3 of 10 series · 12 of 46 positions shown, 18 images · IV contrast (Omnipaque 300)
Comparison: None.

CLINICAL DATA: BPH, urinary incontinence.

EXAM:
CT ABDOMEN AND PELVIS WITHOUT AND WITH CONTRAST
TECHNIQUE: Multidetector CT imaging of the abdomen and pelvis was performed
without contrast material in one or both body regions, followed by
contrast material(s) and further sections in one or both body
regions.
CONTRAST:  125mL OMNIPAQUE IOHEXOL 300 MG/ML  SOLN

[Series 2: hematuria pre 5.0 b40f · axial · non-contrast · 0.69mm/px · z∈[-673,-343]mm · 6 of 94 slices shown, 11 images]
[im 14/94  soft-tissue]
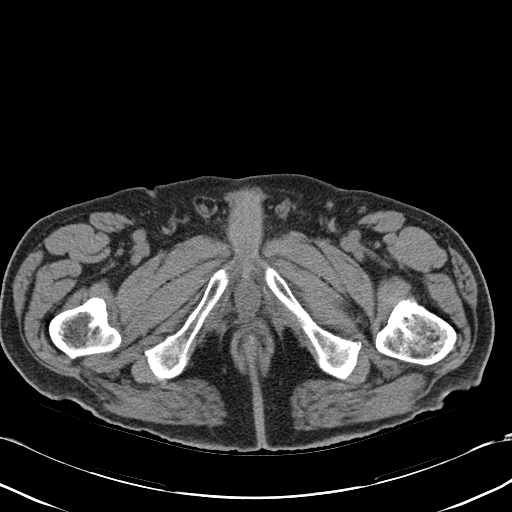
[im 14/94  bone]
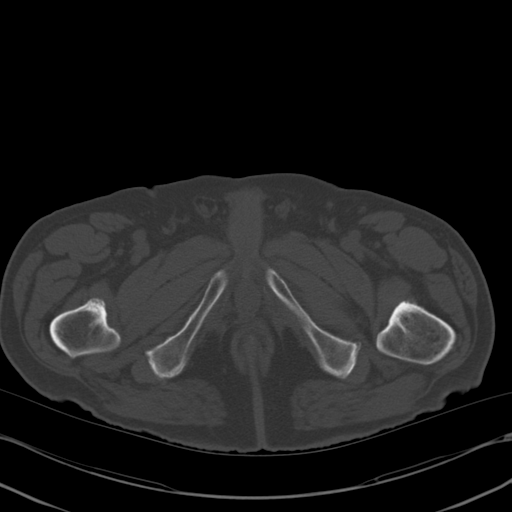
[im 27/94  soft-tissue]
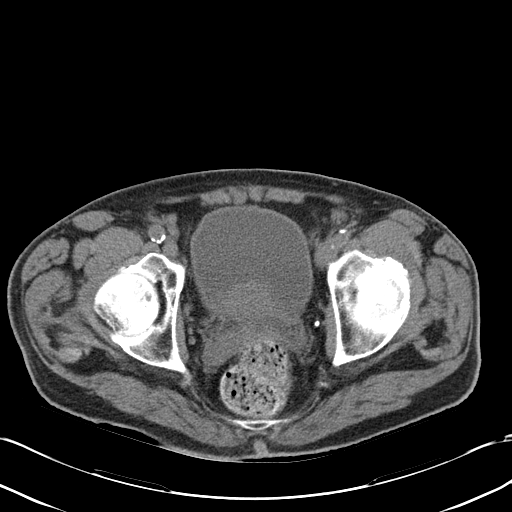
[im 40/94  soft-tissue]
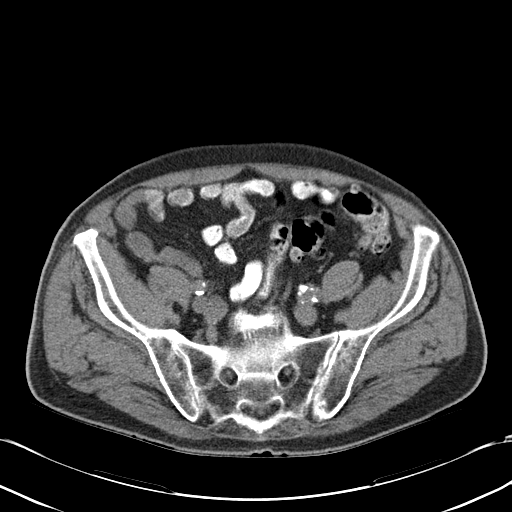
[im 40/94  lung]
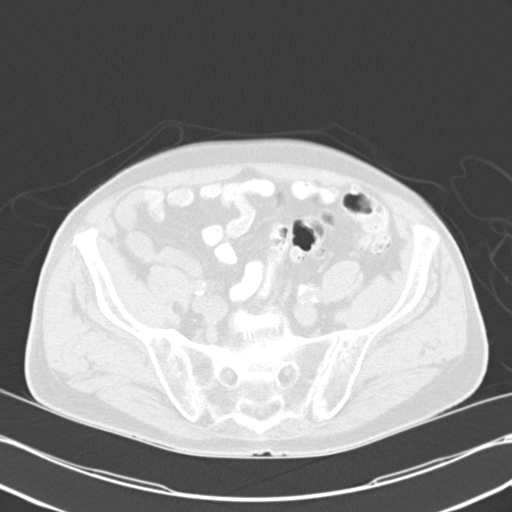
[im 54/94  soft-tissue]
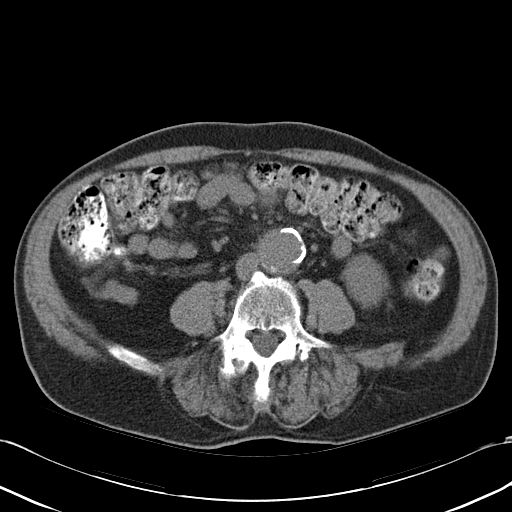
[im 54/94  lung]
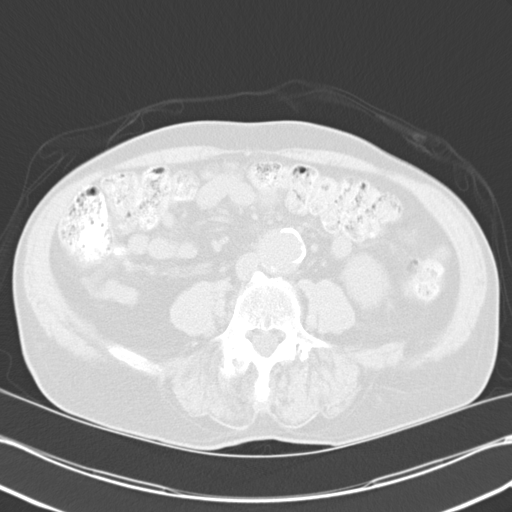
[im 67/94  soft-tissue]
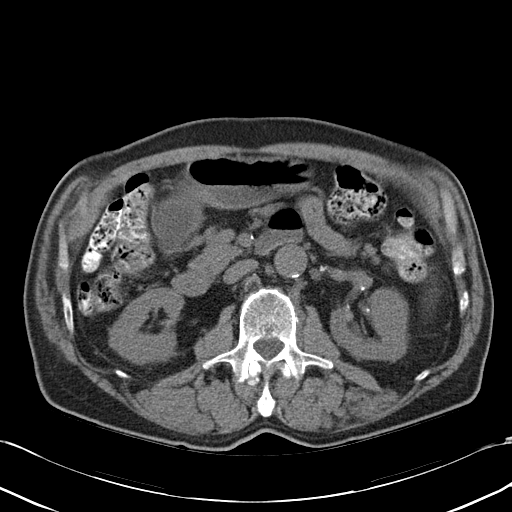
[im 67/94  lung]
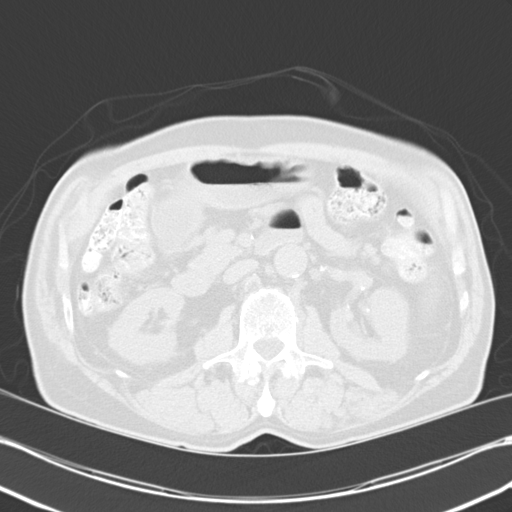
[im 80/94  soft-tissue]
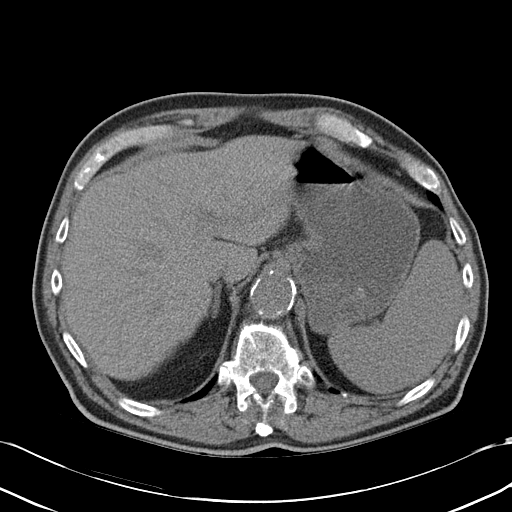
[im 80/94  lung]
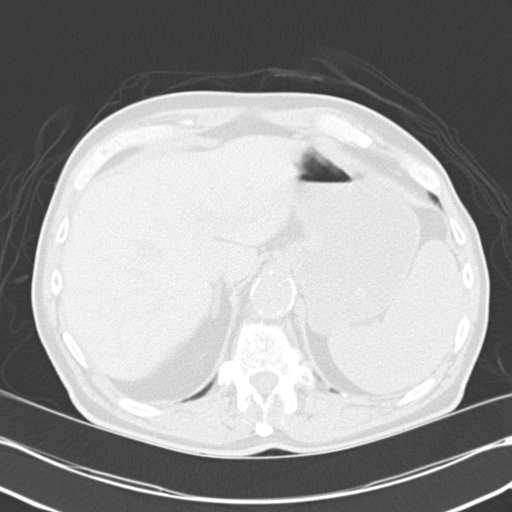

[Series 3: hematuria mpr pre coronal 3.0 · coronal · non-contrast · 0.72mm/px · 2 of 80 slices shown, 3 images]
[im 27/80  soft-tissue]
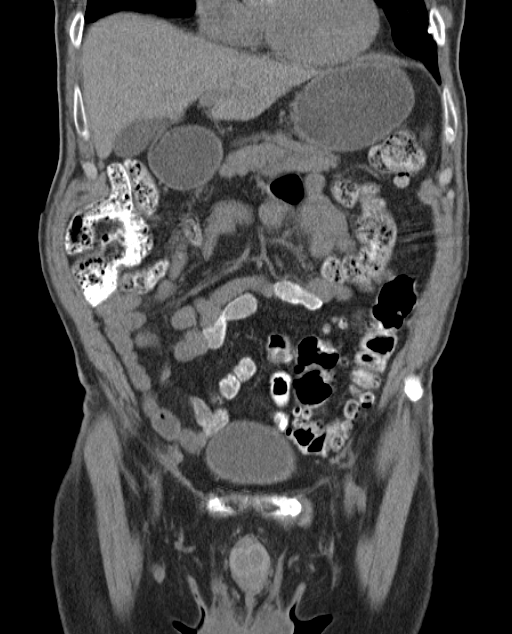
[im 27/80  bone]
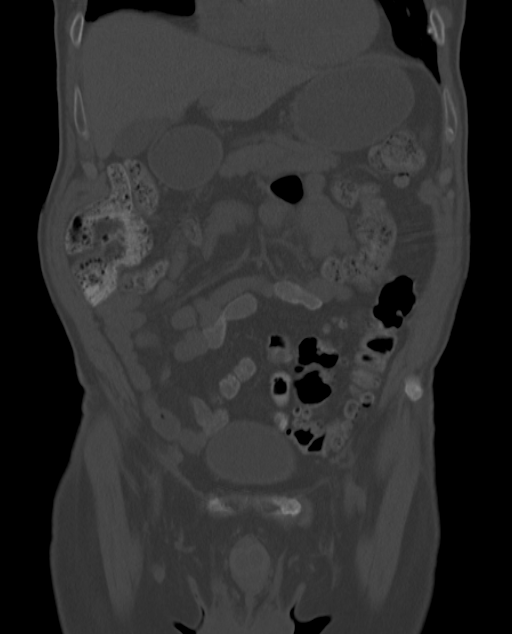
[im 53/80  soft-tissue]
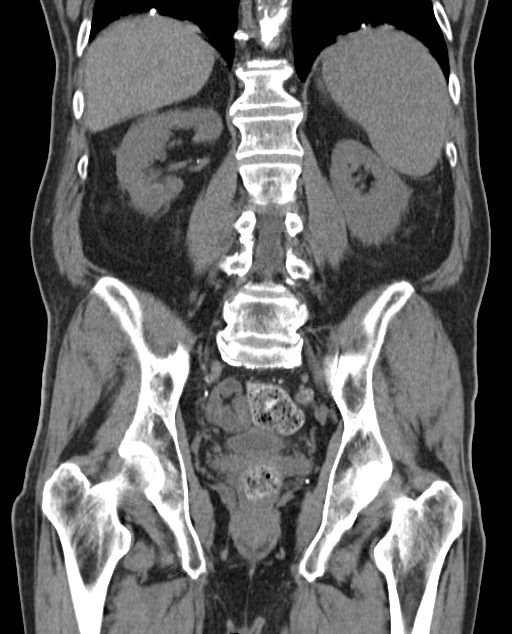

[Series 6: hematuria post axial 5.0 b40f · axial · 0.69mm/px · z∈[-673,-473]mm · 4 of 94 slices shown]
[im 14/94  soft-tissue]
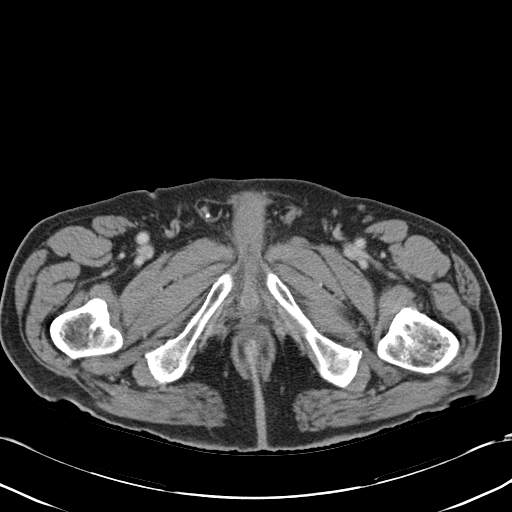
[im 27/94  soft-tissue]
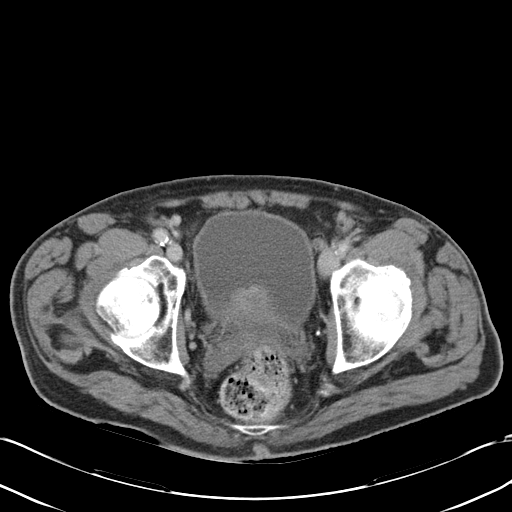
[im 40/94  soft-tissue]
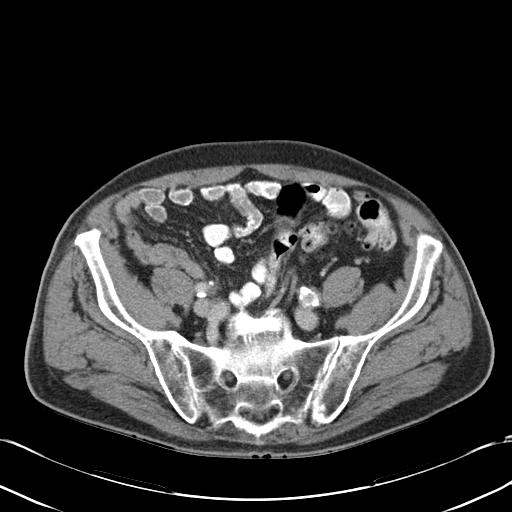
[im 54/94  soft-tissue]
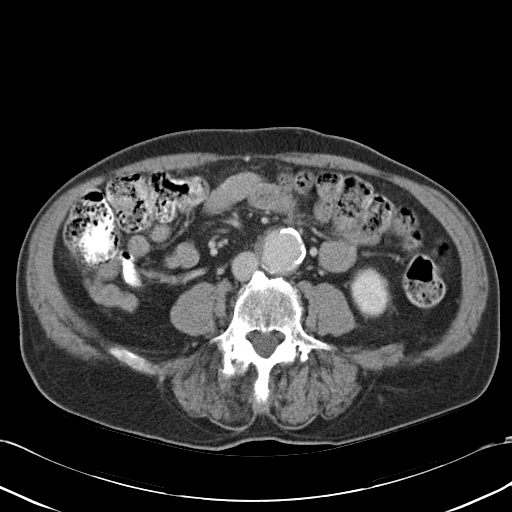

[12 of 46 positions shown; findings below may reference images not displayed]

FINDINGS: Extensive calcified pleural plaques in the lower chest bilaterally,
low presumably asbestos related pleural disease. 19 mm low-density
nodule noted posteriorly in the right lung base. This measures fatty
density and may be reflect a small posterior diaphoretic hernia.
Heart is borderline in size. No pleural effusions.

Precontrast images through the kidneys demonstrate renal vascular
calcifications in the renal hila bilaterally. No urinary tract
stones. No hydronephrosis. Post-contrast images demonstrate
symmetric enhancement of the kidneys bilaterally with small
hypodensities bilaterally compatible with small renal cysts. The
prostate is enlarged, measuring 5.8 x 4.3 cm. Delayed images through
the kidneys and bladder demonstrate no focal bladder wall
abnormality. Slight diffuse bladder wall thickening likely related
to the enlarged prostate. No ureteral abnormality.

Liver, gallbladder, spleen, pancreas, adrenals are unremarkable.
Moderate stool throughout the colon. Sigmoid and descending colonic
diverticulosis. No active diverticulitis. Small bowel is
decompressed. No free fluid, free air or adenopathy.

Aorta is calcified. Mild focal infrarenal abdominal aortic aneurysm
measuring up to 3.6 cm on image 39 of series 6. Iliac vessels are
heavily calcified without aneurysm.

Degenerative changes in the lumbar spine. No acute bony abnormality
or focal bone lesion.
IMPRESSION: Prostate enlargement. Slight associated bladder wall thickening
without focal bladder wall abnormality.

Small bilateral renal cysts. No urinary tract stones or
hydronephrosis.

3.6 cm focal infrarenal abdominal aortic aneurysm.

Left colonic diverticulosis.  No active diverticulitis.

Extensive bilateral calcified pleural plaques compatible with
asbestos related pleural disease. Fat density 1.9 cm nodular area in
the right lung base posteriorly. I favor this represents herniated
fat related to a posterior diaphragmatic hernia.
